# Patient Record
Sex: Female | Born: 1990 | Race: White | Hispanic: No | Marital: Married | Smoking: Never smoker
Health system: Southern US, Community
[De-identification: ages and names within clinical notes are randomized; demographics above are authoritative.]

## PROBLEM LIST (undated history)

## (undated) DIAGNOSIS — G8929 Other chronic pain: Secondary | ICD-10-CM

## (undated) DIAGNOSIS — K589 Irritable bowel syndrome without diarrhea: Secondary | ICD-10-CM

## (undated) DIAGNOSIS — R110 Nausea: Secondary | ICD-10-CM

## (undated) DIAGNOSIS — G43909 Migraine, unspecified, not intractable, without status migrainosus: Secondary | ICD-10-CM

## (undated) DIAGNOSIS — A6920 Lyme disease, unspecified: Secondary | ICD-10-CM

## (undated) DIAGNOSIS — R1013 Epigastric pain: Secondary | ICD-10-CM

## (undated) HISTORY — DX: Epigastric pain: R10.13

## (undated) HISTORY — DX: Other chronic pain: G89.29

## (undated) HISTORY — PX: TUBAL LIGATION: SHX77

## (undated) HISTORY — DX: Migraine, unspecified, not intractable, without status migrainosus: G43.909

## (undated) HISTORY — DX: Nausea: R11.0

## (undated) HISTORY — PX: UPPER GASTROINTESTINAL ENDOSCOPY: SHX188

## (undated) HISTORY — PX: BREAST SURGERY: SHX581

## (undated) HISTORY — PX: TONSILLECTOMY: SUR1361

## (undated) HISTORY — PX: WISDOM TOOTH EXTRACTION: SHX21

## (undated) HISTORY — DX: Lyme disease, unspecified: A69.20

---
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
## 2010-08-13 NOTE — Op Note (Signed)
NAMEJARETZY, LHOMMEDIEU               ACCOUNT NO.:  [id number]   MEDICAL RECORD NO.:  [id number]          PATIENT TYPE:  AMB   LOCATION:  DSC                          FACILITY:  [facility]   PHYSICIAN:  Karol T. Lazarus Salines, M.D. DATE OF BIRTH:  [birth year]   DATE OF PROCEDURE:  02/13/2006  DATE OF DISCHARGE:                               OPERATIVE REPORT   PREOPERATIVE DIAGNOSIS:  Recurrent adenotonsillitis.  Obstructive  hypertrophy.   POSTOPERATIVE DIAGNOSIS:  Recurrent adenotonsillitis.  Obstructive  hypertrophy.   PROCEDURE PERFORMED:  Tonsillectomy, adenoid ablation.   SURGEON:  Gloris Manchester. Lazarus Salines, M.D.   ANESTHESIA:  General orotracheal.   BLOOD LOSS:  10 mL.   COMPLICATIONS:  None.   FINDINGS:  2+ imbedded tonsils with cryptic debris.  Normal soft palate.  Modest residual adenoid pad in between the eustachian tori.   PROCEDURE:  With the patient in the comfortable supine position, general  orotracheal anesthesia was induced without difficulty.  At an  appropriate level, table was turned 90 degrees and the patient placed in  Trendelenburg.  A clean preparation and draping was accomplished.  Taking care to protect lips, teeth, and endotracheal tube, the Crowe-  Davis mouth gag was introduced, expanded for visualization, and  suspended from the Mayo stand in the standard fashion.  The findings  were as described above.  Palate retractor and mirror were used to  visualize the nasopharynx with the findings as described above.  Adenoid  ablation was elected.  1/2% Xylocaine with 1:200,000 epinephrine, 8 mL  total was infiltrated into the peritonsillar planes for intraoperative  hemostasis.  Several minutes were allowed for this to take effect.   A red rubber catheter was passed through the nose and out the mouth to  serve as a Producer, television/film/video.  Using suction cautery and indirect  visualization, the adenoid pad was ablated.  Hemostasis was achieved.   After completing adenoid  ablation, the left tonsil was grasped and  retracted medially.  The mucosa overlying the anterior and superior  poles was coagulated and then cut down to the capsule of the tonsil.  Using the cautery tip as a blunt dissector, lysing fibrous bands, and  coagulating crossing vessels as identified, the tonsil was dissected  from its muscular fossa from inferiorly upwards.  The tonsil was removed  in its entirety as determined by examination both tonsil and fossa.  A  small additional quantity of cautery rendered the fossa hemostatic.  After completing the left tonsillectomy, the mouth gag was relaxed for  several minutes at the tongue had become rather engorged/cyanotic.   After several minutes, the gag was re-expanded and the right tonsil was  removed in identical fashion.   After removing both tonsils and rendering the oropharynx hemostatic, the  nasopharynx was again inspected and observed to be hemostatic.  At this  point the palate retractor and mouth gag were relaxed for several  minutes.  Upon re-expansion, hemostasis was persistent.  At this point  the procedure was completed.  The palate retractor and mouth gag were  relaxed and removed.  The dental status was  intact.  The patient was  returned to Anesthesia, awakened, extubated, and transferred to recovery  in stable condition.   COMMENT:  20 year old white female with recurrent strep throat and  tonsillitis was indication for today's procedure.  Anticipate routine  postoperative recovery with attention to analgesia, antibiosis,  hydration, and observation for bleeding, emesis, or airway compromise.      Gloris Manchester. Lazarus Salines, M.D.  Electronically Signed     KTW/MEDQ  D:  02/13/2006  T:  02/13/2006  Job:  [id number]   cc:   Rosalyn Gess, M.D.

## 2011-03-14 ENCOUNTER — Ambulatory Visit (INDEPENDENT_AMBULATORY_CARE_PROVIDER_SITE_OTHER): Payer: PRIVATE HEALTH INSURANCE

## 2011-03-14 DIAGNOSIS — J111 Influenza due to unidentified influenza virus with other respiratory manifestations: Secondary | ICD-10-CM

## 2011-03-14 LAB — HM PAP SMEAR: HM Pap smear: NORMAL

## 2011-04-02 ENCOUNTER — Ambulatory Visit (INDEPENDENT_AMBULATORY_CARE_PROVIDER_SITE_OTHER): Payer: PRIVATE HEALTH INSURANCE

## 2011-04-02 DIAGNOSIS — R55 Syncope and collapse: Secondary | ICD-10-CM

## 2011-04-02 DIAGNOSIS — J111 Influenza due to unidentified influenza virus with other respiratory manifestations: Secondary | ICD-10-CM

## 2011-04-02 DIAGNOSIS — R509 Fever, unspecified: Secondary | ICD-10-CM

## 2011-04-02 DIAGNOSIS — R5381 Other malaise: Secondary | ICD-10-CM

## 2011-10-10 ENCOUNTER — Telehealth: Payer: Self-pay

## 2011-10-10 NOTE — Telephone Encounter (Signed)
The patient is requesting any labs done in the last year be faxed to her office fax # [phone].  The patient's cell # is [phone] and her work telephone number is [phone] ext [phone]

## 2011-10-10 NOTE — Telephone Encounter (Signed)
Printed out phone message due to patient only having a paper chart. °

## 2011-11-11 ENCOUNTER — Ambulatory Visit: Payer: Self-pay | Admitting: Internal Medicine

## 2011-11-14 ENCOUNTER — Other Ambulatory Visit (INDEPENDENT_AMBULATORY_CARE_PROVIDER_SITE_OTHER): Payer: Self-pay

## 2011-11-14 ENCOUNTER — Encounter: Payer: Self-pay | Admitting: Internal Medicine

## 2011-11-14 ENCOUNTER — Ambulatory Visit (INDEPENDENT_AMBULATORY_CARE_PROVIDER_SITE_OTHER): Payer: Self-pay | Admitting: Internal Medicine

## 2011-11-14 VITALS — BP 108/70 | HR 61 | Temp 97.3°F | Resp 14 | Ht 64.0 in | Wt 107.2 lb

## 2011-11-14 DIAGNOSIS — Z0000 Encounter for general adult medical examination without abnormal findings: Secondary | ICD-10-CM

## 2011-11-14 DIAGNOSIS — H547 Unspecified visual loss: Secondary | ICD-10-CM

## 2011-11-14 DIAGNOSIS — R079 Chest pain, unspecified: Secondary | ICD-10-CM

## 2011-11-14 LAB — CBC WITH DIFFERENTIAL/PLATELET
Basophils Absolute: 0 10*3/uL (ref 0.0–0.1)
Eosinophils Relative: 2 % (ref 0.0–5.0)
HCT: 44 % (ref 36.0–46.0)
Hemoglobin: 14.9 g/dL (ref 12.0–15.0)
Lymphocytes Relative: 35.2 % (ref 12.0–46.0)
Lymphs Abs: 2 10*3/uL (ref 0.7–4.0)
MCHC: 33.8 g/dL (ref 30.0–36.0)
MCV: 90.1 fl (ref 78.0–100.0)
Monocytes Relative: 7.5 % (ref 3.0–12.0)
Neutro Abs: 3.2 10*3/uL (ref 1.4–7.7)
Neutrophils Relative %: 54.7 % (ref 43.0–77.0)
RBC: 4.88 Mil/uL (ref 3.87–5.11)
RDW: 12.4 % (ref 11.5–14.6)
WBC: 5.8 10*3/uL (ref 4.5–10.5)

## 2011-11-14 LAB — LIPID PANEL
Cholesterol: 180 mg/dL (ref 0–200)
HDL: 54.9 mg/dL (ref >39.00)
Total CHOL/HDL Ratio: 3
Triglycerides: 68 mg/dL (ref 0.0–149.0)
VLDL: 13.6 mg/dL (ref 0.0–40.0)

## 2011-11-14 LAB — URINALYSIS, ROUTINE W REFLEX MICROSCOPIC
Bilirubin Urine: NEGATIVE
Hgb urine dipstick: NEGATIVE
Ketones, ur: NEGATIVE
Leukocytes, UA: NEGATIVE
Nitrite: NEGATIVE
Total Protein, Urine: NEGATIVE
Urine Glucose: NEGATIVE
pH: 6 (ref 5.0–8.0)

## 2011-11-14 LAB — COMPREHENSIVE METABOLIC PANEL
ALT: 16 U/L (ref 0–35)
AST: 21 U/L (ref 0–37)
Albumin: 4.3 g/dL (ref 3.5–5.2)
Alkaline Phosphatase: 47 U/L (ref 39–117)
BUN: 15 mg/dL (ref 6–23)
CO2: 27 mEq/L (ref 19–32)
Calcium: 9.7 mg/dL (ref 8.4–10.5)
Chloride: 104 mEq/L (ref 96–112)
Creatinine, Ser: 0.7 mg/dL (ref 0.4–1.2)
GFR: 110.1 mL/min (ref >60.00)
Glucose, Bld: 83 mg/dL (ref 70–99)
Total Bilirubin: 0.8 mg/dL (ref 0.3–1.2)

## 2011-11-14 NOTE — Patient Instructions (Signed)
Chest Pain (Nonspecific) It is often hard to give a specific diagnosis for the cause of chest pain. There is always a chance that your pain could be related to something serious, such as a heart attack or a blood clot in the lungs. You need to follow up with your caregiver for further evaluation. CAUSES   Heartburn.   Pneumonia or bronchitis.   Anxiety or stress.   Inflammation around your heart (pericarditis) or lung (pleuritis or pleurisy).   A blood clot in the lung.   A collapsed lung (pneumothorax). It can develop suddenly on its own (spontaneous pneumothorax) or from injury (trauma) to the chest.   Shingles infection (herpes zoster virus).  The chest wall is composed of bones, muscles, and cartilage. Any of these can be the source of the pain.  The bones can be bruised by injury.   The muscles or cartilage can be strained by coughing or overwork.   The cartilage can be affected by inflammation and become sore (costochondritis).  DIAGNOSIS  Lab tests or other studies, such as X-rays, electrocardiography, stress testing, or cardiac imaging, may be needed to find the cause of your pain.  TREATMENT   Treatment depends on what may be causing your chest pain. Treatment may include:   Acid blockers for heartburn.   Anti-inflammatory medicine.   Pain medicine for inflammatory conditions.   Antibiotics if an infection is present.   You may be advised to change lifestyle habits. This includes stopping smoking and avoiding alcohol, caffeine, and chocolate.   You may be advised to keep your head raised (elevated) when sleeping. This reduces the chance of acid going backward from your stomach into your esophagus.   Most of the time, nonspecific chest pain will improve within 2 to 3 days with rest and mild pain medicine.  HOME CARE INSTRUCTIONS   If antibiotics were prescribed, take your antibiotics as directed. Finish them even if you start to feel better.   For the next few  days, avoid physical activities that bring on chest pain. Continue physical activities as directed.   Do not smoke.   Avoid drinking alcohol.   Only take over-the-counter or prescription medicine for pain, discomfort, or fever as directed by your caregiver.   Follow your caregiver's suggestions for further testing if your chest pain does not go away.   Keep any follow-up appointments you made. If you do not go to an appointment, you could develop lasting (chronic) problems with pain. If there is any problem keeping an appointment, you must call to reschedule.  SEEK MEDICAL CARE IF:   You think you are having problems from the medicine you are taking. Read your medicine instructions carefully.   Your chest pain does not go away, even after treatment.   You develop a rash with blisters on your chest.  SEEK IMMEDIATE MEDICAL CARE IF:   You have increased chest pain or pain that spreads to your arm, neck, jaw, back, or abdomen.   You develop shortness of breath, an increasing cough, or you are coughing up blood.   You have severe back or abdominal pain, feel nauseous, or vomit.   You develop severe weakness, fainting, or chills.   You have a fever.  THIS IS AN EMERGENCY. Do not wait to see if the pain will go away. Get medical help at once. Call your local emergency services (911 in [place] Do not drive yourself to the hospital. MAKE SURE YOU:   Understand these instructions.     Will watch your condition.   Will get help right away if you are not doing well or get worse.  Document Released: 12/22/2004 Document Revised: 03/03/2011 Document Reviewed: 10/18/2007 [facility] Patient Information 2012 [place], [place].Preventive Care for Adults, Female A healthy lifestyle and preventive care can promote health and wellness. Preventive health guidelines for women include the following key practices.  A routine yearly physical is a good way to check with your caregiver about your health and  preventive screening. It is a chance to share any concerns and updates on your health, and to receive a thorough exam.   Visit your dentist for a routine exam and preventive care every 6 months. Brush your teeth twice a day and floss once a day. Good oral hygiene prevents tooth decay and gum disease.   The frequency of eye exams is based on your age, health, family medical history, use of contact lenses, and other factors. Follow your caregiver's recommendations for frequency of eye exams.   Eat a healthy diet. Foods like vegetables, fruits, whole grains, low-fat dairy products, and lean protein foods contain the nutrients you need without too many calories. Decrease your intake of foods high in solid fats, added sugars, and salt. Eat the right amount of calories for you.Get information about a proper diet from your caregiver, if necessary.   Regular physical exercise is one of the most important things you can do for your health. Most adults should get at least 150 minutes of moderate-intensity exercise (any activity that increases your heart rate and causes you to sweat) each week. In addition, most adults need muscle-strengthening exercises on 2 or more days a week.   Maintain a healthy weight. The body mass index (BMI) is a screening tool to identify possible weight problems. It provides an estimate of body fat based on height and weight. Your caregiver can help determine your BMI, and can help you achieve or maintain a healthy weight.For adults 20 years and older:   A BMI below 18.5 is considered underweight.   A BMI of 18.5 to 24.9 is normal.   A BMI of 25 to 29.9 is considered overweight.   A BMI of 30 and above is considered obese.   Maintain normal blood lipids and cholesterol levels by exercising and minimizing your intake of saturated fat. Eat a balanced diet with plenty of fruit and vegetables. Blood tests for lipids and cholesterol should begin at age 57 and be repeated every 5  years. If your lipid or cholesterol levels are high, you are over 50, or you are at high risk for heart disease, you may need your cholesterol levels checked more frequently.Ongoing high lipid and cholesterol levels should be treated with medicines if diet and exercise are not effective.   If you smoke, find out from your caregiver how to quit. If you do not use tobacco, do not start.   If you are pregnant, do not drink alcohol. If you are breastfeeding, be very cautious about drinking alcohol. If you are not pregnant and choose to drink alcohol, do not exceed 1 drink per day. One drink is considered to be 12 ounces (355 mL) of beer, 5 ounces (148 mL) of wine, or 1.5 ounces (44 mL) of liquor.   Avoid use of street drugs. Do not share needles with anyone. Ask for help if you need support or instructions about stopping the use of drugs.   High blood pressure causes heart disease and increases the risk of stroke. Your blood  pressure should be checked at least every 1 to 2 years. Ongoing high blood pressure should be treated with medicines if weight loss and exercise are not effective.   If you are 2 to 23 years old, ask your caregiver if you should take aspirin to prevent strokes.   Diabetes screening involves taking a blood sample to check your fasting blood sugar level. This should be done once every 3 years, after age 29, if you are within normal weight and without risk factors for diabetes. Testing should be considered at a younger age or be carried out more frequently if you are overweight and have at least 1 risk factor for diabetes.   Breast cancer screening is essential preventive care for women. You should practice "breast self-awareness." This means understanding the normal appearance and feel of your breasts and may include breast self-examination. Any changes detected, no matter how small, should be reported to a caregiver. Women in their 34s and 30s should have a clinical breast exam (CBE)  by a caregiver as part of a regular health exam every 1 to 3 years. After age 41, women should have a CBE every year. Starting at age 65, women should consider having a mammography (breast X-ray test) every year. Women who have a family history of breast cancer should talk to their caregiver about genetic screening. Women at a high risk of breast cancer should talk to their caregivers about having magnetic resonance imaging (MRI) and a mammography every year.   The Pap test is a screening test for cervical cancer. A Pap test can show cell changes on the cervix that might become cervical cancer if left untreated. A Pap test is a procedure in which cells are obtained and examined from the lower end of the uterus (cervix).   Women should have a Pap test starting at age 59.   Between ages 54 and 46, Pap tests should be repeated every 2 years.   Beginning at age 62, you should have a Pap test every 3 years as long as the past 3 Pap tests have been normal.   Some women have medical problems that increase the chance of getting cervical cancer. Talk to your caregiver about these problems. It is especially important to talk to your caregiver if a new problem develops soon after your last Pap test. In these cases, your caregiver may recommend more frequent screening and Pap tests.   The above recommendations are the same for women who have or have not gotten the vaccine for human papillomavirus (HPV).   If you had a hysterectomy for a problem that was not cancer or a condition that could lead to cancer, then you no longer need Pap tests. Even if you no longer need a Pap test, a regular exam is a good idea to make sure no other problems are starting.   If you are between ages 56 and 38, and you have had normal Pap tests going back 10 years, you no longer need Pap tests. Even if you no longer need a Pap test, a regular exam is a good idea to make sure no other problems are starting.   If you have had past  treatment for cervical cancer or a condition that could lead to cancer, you need Pap tests and screening for cancer for at least 20 years after your treatment.   If Pap tests have been discontinued, risk factors (such as a new sexual partner) need to be reassessed to determine if screening  should be resumed.   The HPV test is an additional test that may be used for cervical cancer screening. The HPV test looks for the virus that can cause the cell changes on the cervix. The cells collected during the Pap test can be tested for HPV. The HPV test could be used to screen women aged 87 years and older, and should be used in women of any age who have unclear Pap test results. After the age of 26, women should have HPV testing at the same frequency as a Pap test.   Colorectal cancer can be detected and often prevented. Most routine colorectal cancer screening begins at the age of 87 and continues through age 28. However, your caregiver may recommend screening at an earlier age if you have risk factors for colon cancer. On a yearly basis, your caregiver may provide home test kits to check for hidden blood in the stool. Use of a small camera at the end of a tube, to directly examine the colon (sigmoidoscopy or colonoscopy), can detect the earliest forms of colorectal cancer. Talk to your caregiver about this at age 24, when routine screening begins. Direct examination of the colon should be repeated every 5 to 10 years through age 17, unless early forms of pre-cancerous polyps or small growths are found.   Hepatitis C blood testing is recommended for all people born from [birth year] through 1965 and any individual with known risks for hepatitis C.   Practice safe sex. Use condoms and avoid high-risk sexual practices to reduce the spread of sexually transmitted infections (STIs). STIs include gonorrhea, chlamydia, syphilis, trichomonas, herpes, HPV, and human immunodeficiency virus (HIV). Herpes, HIV, and HPV are viral  illnesses that have no cure. They can result in disability, cancer, and death. Sexually active women aged 66 and younger should be checked for chlamydia. Older women with new or multiple partners should also be tested for chlamydia. Testing for other STIs is recommended if you are sexually active and at increased risk.   Osteoporosis is a disease in which the bones lose minerals and strength with aging. This can result in serious bone fractures. The risk of osteoporosis can be identified using a bone density scan. Women ages 57 and over and women at risk for fractures or osteoporosis should discuss screening with their caregivers. Ask your caregiver whether you should take a calcium supplement or vitamin D to reduce the rate of osteoporosis.   Menopause can be associated with physical symptoms and risks. Hormone replacement therapy is available to decrease symptoms and risks. You should talk to your caregiver about whether hormone replacement therapy is right for you.   Use sunscreen with sun protection factor (SPF) of 30 or more. Apply sunscreen liberally and repeatedly throughout the day. You should seek shade when your shadow is shorter than you. Protect yourself by wearing long sleeves, pants, a wide-brimmed hat, and sunglasses year round, whenever you are outdoors.   Once a month, do a whole body skin exam, using a mirror to look at the skin on your back. Notify your caregiver of new moles, moles that have irregular borders, moles that are larger than a pencil eraser, or moles that have changed in shape or color.   Stay current with required immunizations.   Influenza. You need a dose every fall (or winter). The composition of the flu vaccine changes each year, so being vaccinated once is not enough.   Pneumococcal polysaccharide. You need 1 to 2 doses if you smoke  cigarettes or if you have certain chronic medical conditions. You need 1 dose at age 58 (or older) if you have never been vaccinated.     Tetanus, diphtheria, pertussis (Tdap, Td). Get 1 dose of Tdap vaccine if you are younger than age 60, are over 98 and have contact with an infant, are a Research scientist (physical sciences), are pregnant, or simply want to be protected from whooping cough. After that, you need a Td booster dose every 10 years. Consult your caregiver if you have not had at least 3 tetanus and diphtheria-containing shots sometime in your life or have a deep or dirty wound.   HPV. You need this vaccine if you are a woman age 51 or younger. The vaccine is given in 3 doses over 6 months.   Measles, mumps, rubella (MMR). You need at least 1 dose of MMR if you were born in 1957 or later. You may also need a second dose.   Meningococcal. If you are age 24 to 36 and a first-year college student living in a residence hall, or have one of several medical conditions, you need to get vaccinated against meningococcal disease. You may also need additional booster doses.   Zoster (shingles). If you are age 58 or older, you should get this vaccine.   Varicella (chickenpox). If you have never had chickenpox or you were vaccinated but received only 1 dose, talk to your caregiver to find out if you need this vaccine.   Hepatitis A. You need this vaccine if you have a specific risk factor for hepatitis A virus infection or you simply wish to be protected from this disease. The vaccine is usually given as 2 doses, 6 to 18 months apart.   Hepatitis B. You need this vaccine if you have a specific risk factor for hepatitis B virus infection or you simply wish to be protected from this disease. The vaccine is given in 3 doses, usually over 6 months.  Preventive Services / Frequency Ages 73 to 2  Blood pressure check.** / Every 1 to 2 years.   Lipid and cholesterol check.** / Every 5 years beginning at age 40.   Clinical breast exam.** / Every 3 years for women in their 47s and 30s.   Pap test.** / Every 2 years from ages 79 through 25. Every 3  years starting at age 91 through age 66 or 65 with a history of 3 consecutive normal Pap tests.   HPV screening.** / Every 3 years from ages 38 through ages 79 to 12 with a history of 3 consecutive normal Pap tests.   Hepatitis C blood test.** / For any individual with known risks for hepatitis C.   Skin self-exam. / Monthly.   Influenza immunization.** / Every year.   Pneumococcal polysaccharide immunization.** / 1 to 2 doses if you smoke cigarettes or if you have certain chronic medical conditions.   Tetanus, diphtheria, pertussis (Tdap, Td) immunization. / A one-time dose of Tdap vaccine. After that, you need a Td booster dose every 10 years.   HPV immunization. / 3 doses over 6 months, if you are 33 and younger.   Measles, mumps, rubella (MMR) immunization. / You need at least 1 dose of MMR if you were born in 1959 or later. You may also need a second dose.   Meningococcal immunization. / 1 dose if you are age 12 to 44 and a first-year college student living in a residence hall, or have one of several medical conditions, you  need to get vaccinated against meningococcal disease. You may also need additional booster doses.   Varicella immunization.** / Consult your caregiver.   Hepatitis A immunization.** / Consult your caregiver. 2 doses, 6 to 18 months apart.   Hepatitis B immunization.** / Consult your caregiver. 3 doses usually over 6 months.  Ages 29 to 12  Blood pressure check.** / Every 1 to 2 years.   Lipid and cholesterol check.** / Every 5 years beginning at age 19.   Clinical breast exam.** / Every year after age 50.   Mammogram.** / Every year beginning at age 46 and continuing for as long as you are in good health. Consult with your caregiver.   Pap test.** / Every 3 years starting at age 53 through age 52 or 46 with a history of 3 consecutive normal Pap tests.   HPV screening.** / Every 3 years from ages 50 through ages 85 to 35 with a history of 3 consecutive  normal Pap tests.   Fecal occult blood test (FOBT) of stool. / Every year beginning at age 76 and continuing until age 54. You may not need to do this test if you get a colonoscopy every 10 years.   Flexible sigmoidoscopy or colonoscopy.** / Every 5 years for a flexible sigmoidoscopy or every 10 years for a colonoscopy beginning at age 86 and continuing until age 25.   Hepatitis C blood test.** / For all people born from [birth year] through 1967 and any individual with known risks for hepatitis C.   Skin self-exam. / Monthly.   Influenza immunization.** / Every year.   Pneumococcal polysaccharide immunization.** / 1 to 2 doses if you smoke cigarettes or if you have certain chronic medical conditions.   Tetanus, diphtheria, pertussis (Tdap, Td) immunization.** / A one-time dose of Tdap vaccine. After that, you need a Td booster dose every 10 years.   Measles, mumps, rubella (MMR) immunization. / You need at least 1 dose of MMR if you were born in 1957 or later. You may also need a second dose.   Varicella immunization.** / Consult your caregiver.   Meningococcal immunization.** / Consult your caregiver.   Hepatitis A immunization.** / Consult your caregiver. 2 doses, 6 to 18 months apart.   Hepatitis B immunization.** / Consult your caregiver. 3 doses, usually over 6 months.  Ages 77 and over  Blood pressure check.** / Every 1 to 2 years.   Lipid and cholesterol check.** / Every 5 years beginning at age 64.   Clinical breast exam.** / Every year after age 55.   Mammogram.** / Every year beginning at age 87 and continuing for as long as you are in good health. Consult with your caregiver.   Pap test.** / Every 3 years starting at age 90 through age 63 or 53 with a 3 consecutive normal Pap tests. Testing can be stopped between 65 and 70 with 3 consecutive normal Pap tests and no abnormal Pap or HPV tests in the past 10 years.   HPV screening.** / Every 3 years from ages 21 through ages 55  or 45 with a history of 3 consecutive normal Pap tests. Testing can be stopped between 65 and 70 with 3 consecutive normal Pap tests and no abnormal Pap or HPV tests in the past 10 years.   Fecal occult blood test (FOBT) of stool. / Every year beginning at age 50 and continuing until age 22. You may not need to do this test if you get a  colonoscopy every 10 years.   Flexible sigmoidoscopy or colonoscopy.** / Every 5 years for a flexible sigmoidoscopy or every 10 years for a colonoscopy beginning at age 36 and continuing until age 44.   Hepatitis C blood test.** / For all people born from [birth year] through 1967 and any individual with known risks for hepatitis C.   Osteoporosis screening.** / A one-time screening for women ages 69 and over and women at risk for fractures or osteoporosis.   Skin self-exam. / Monthly.   Influenza immunization.** / Every year.   Pneumococcal polysaccharide immunization.** / 1 dose at age 36 (or older) if you have never been vaccinated.   Tetanus, diphtheria, pertussis (Tdap, Td) immunization. / A one-time dose of Tdap vaccine if you are over 65 and have contact with an infant, are a Research scientist (physical sciences), or simply want to be protected from whooping cough. After that, you need a Td booster dose every 10 years.   Varicella immunization.** / Consult your caregiver.   Meningococcal immunization.** / Consult your caregiver.   Hepatitis A immunization.** / Consult your caregiver. 2 doses, 6 to 18 months apart.   Hepatitis B immunization.** / Check with your caregiver. 3 doses, usually over 6 months.  ** Family history and personal history of risk and conditions may change your caregiver's recommendations. Document Released: 05/10/2001 Document Revised: 03/03/2011 Document Reviewed: 08/09/2010 [facility] Patient Information 2012 [place], [place].

## 2011-11-14 NOTE — Assessment & Plan Note (Signed)
She has had this pain for 3 years. Her EKG is normal today. This is musculoskeletal chest wall pain. She was offered reassurance.

## 2011-11-14 NOTE — Progress Notes (Signed)
Subjective:    Patient ID: Stacey Mccullough, female    DOB: 1990/05/13, 21 y.o.   MRN: [id number]  Chest Pain  This is a chronic problem. Episode onset: rare episodes of CP for 3 years. The onset quality is gradual. The problem occurs rarely. The problem has been unchanged. Pain location: "at the apex of her heart" The pain is at a severity of 1/10. The pain is mild. The quality of the pain is described as sharp. The pain does not radiate. Pertinent negatives include no abdominal pain, back pain, claudication, cough, diaphoresis, dizziness, exertional chest pressure, fever, headaches, hemoptysis, irregular heartbeat, leg pain, lower extremity edema, malaise/fatigue, nausea, near-syncope, numbness, orthopnea, palpitations, PND, shortness of breath, sputum production, syncope, vomiting or weakness. She has tried nothing for the symptoms. Risk factors include no known risk factors.  Pertinent negatives for past medical history include no seizures.      Review of Systems  Constitutional: Negative for fever, chills, malaise/fatigue, diaphoresis, activity change, appetite change, fatigue and unexpected weight change.  HENT: Negative.   Eyes: Positive for visual disturbance (decreased VA at night).  Respiratory: Negative for apnea, cough, hemoptysis, sputum production, choking, chest tightness, shortness of breath, wheezing and stridor.   Cardiovascular: Positive for chest pain. Negative for palpitations, orthopnea, claudication, leg swelling, syncope, PND and near-syncope.  Gastrointestinal: Negative for nausea, vomiting, abdominal pain, diarrhea, constipation, blood in stool, abdominal distention, anal bleeding and rectal pain.  Genitourinary: Negative.   Musculoskeletal: Negative for myalgias, back pain, joint swelling, arthralgias and gait problem.  Skin: Negative for color change, pallor, rash and wound.  Neurological: Negative for dizziness, tremors, seizures, syncope, facial asymmetry, speech  difficulty, weakness, light-headedness, numbness and headaches.  Hematological: Negative for adenopathy. Does not bruise/bleed easily.  Psychiatric/Behavioral: Negative.        Objective:   Physical Exam  Vitals reviewed. Constitutional: She is oriented to person, place, and time. She appears well-developed and well-nourished. No distress.  HENT:  Head: Normocephalic and atraumatic.  Mouth/Throat: Oropharynx is clear and moist. No oropharyngeal exudate.  Eyes: Conjunctivae are normal. Right eye exhibits no discharge. Left eye exhibits no discharge. No scleral icterus.  Neck: Normal range of motion. Neck supple. No JVD present. No tracheal deviation present. No thyromegaly present.  Cardiovascular: Normal rate, regular rhythm, normal heart sounds and intact distal pulses.  Exam reveals no gallop and no friction rub.   No murmur heard. Pulmonary/Chest: Effort normal and breath sounds normal. No accessory muscle usage or stridor. Not tachypneic. No respiratory distress. She has no wheezes. She has no rales. Chest wall is not dull to percussion. She exhibits no mass, no tenderness, no bony tenderness, no laceration, no crepitus, no edema, no deformity, no swelling and no retraction.  Abdominal: Soft. Bowel sounds are normal. She exhibits no distension and no mass. There is no tenderness. There is no rebound and no guarding.  Musculoskeletal: Normal range of motion. She exhibits no edema and no tenderness.  Lymphadenopathy:    She has no cervical adenopathy.  Neurological: She is oriented to person, place, and time.  Skin: Skin is warm and dry. No rash noted. She is not diaphoretic. No erythema. No pallor.  Psychiatric: She has a normal mood and affect. Her behavior is normal. Judgment and thought content normal.     No results found for this basename: WBC, HGB, HCT, PLT, GLUCOSE, CHOL, TRIG, HDL, LDLDIRECT, LDLCALC, ALT, AST, NA, K, CL, CREATININE, BUN, CO2, TSH, PSA, INR, GLUF, HGBA1C,  MICROALBUR  Assessment & Plan:

## 2011-11-14 NOTE — Assessment & Plan Note (Signed)
Exam done, labs ordered, vaccines were reviewed, pt ed material was given 

## 2011-11-14 NOTE — Assessment & Plan Note (Signed)
Referral to optometry

## 2012-03-15 ENCOUNTER — Emergency Department (HOSPITAL_COMMUNITY)
Admission: EM | Admit: 2012-03-15 | Discharge: 2012-03-15 | Disposition: A | Discharge status: Home / Self Care | Payer: PRIVATE HEALTH INSURANCE | Attending: Emergency Medicine | Admitting: Emergency Medicine

## 2012-03-15 ENCOUNTER — Emergency Department (HOSPITAL_COMMUNITY): Payer: PRIVATE HEALTH INSURANCE

## 2012-03-15 ENCOUNTER — Encounter (HOSPITAL_COMMUNITY): Payer: Self-pay | Admitting: Emergency Medicine

## 2012-03-15 DIAGNOSIS — Z79899 Other long term (current) drug therapy: Secondary | ICD-10-CM | POA: Insufficient documentation

## 2012-03-15 DIAGNOSIS — R112 Nausea with vomiting, unspecified: Secondary | ICD-10-CM | POA: Insufficient documentation

## 2012-03-15 DIAGNOSIS — Z8619 Personal history of other infectious and parasitic diseases: Secondary | ICD-10-CM | POA: Insufficient documentation

## 2012-03-15 DIAGNOSIS — K5900 Constipation, unspecified: Secondary | ICD-10-CM | POA: Insufficient documentation

## 2012-03-15 DIAGNOSIS — R109 Unspecified abdominal pain: Secondary | ICD-10-CM | POA: Insufficient documentation

## 2012-03-15 LAB — COMPREHENSIVE METABOLIC PANEL
ALT: 11 U/L (ref 0–35)
AST: 23 U/L (ref 0–37)
Albumin: 3.6 g/dL (ref 3.5–5.2)
Alkaline Phosphatase: 41 U/L (ref 39–117)
BUN: 14 mg/dL (ref 6–23)
CO2: 22 mEq/L (ref 19–32)
Chloride: 100 mEq/L (ref 96–112)
GFR calc Af Amer: >90 mL/min (ref >90)
GFR calc non Af Amer: >90 mL/min (ref >90)
Glucose, Bld: 88 mg/dL (ref 70–99)
Potassium: 4 mEq/L (ref 3.5–5.1)
Sodium: 132 mEq/L — ABNORMAL LOW (ref 135–145)
Total Bilirubin: 0.8 mg/dL (ref 0.3–1.2)
Total Protein: 6.5 g/dL (ref 6.0–8.3)

## 2012-03-15 LAB — CBC WITH DIFFERENTIAL/PLATELET
Basophils Absolute: 0 10*3/uL (ref 0.0–0.1)
Basophils Relative: 0 % (ref 0–1)
HCT: 39.6 % (ref 36.0–46.0)
Lymphocytes Relative: 8 % — ABNORMAL LOW (ref 12–46)
Lymphs Abs: 0.5 10*3/uL — ABNORMAL LOW (ref 0.7–4.0)
MCH: 29.9 pg (ref 26.0–34.0)
MCHC: 34.8 g/dL (ref 30.0–36.0)
Monocytes Absolute: 0.4 10*3/uL (ref 0.1–1.0)
Monocytes Relative: 6 % (ref 3–12)
Neutro Abs: 5.3 10*3/uL (ref 1.7–7.7)
Neutrophils Relative %: 85 % — ABNORMAL HIGH (ref 43–77)
Platelets: 163 10*3/uL (ref 150–400)
RBC: 4.61 MIL/uL (ref 3.87–5.11)
RDW: 11.8 % (ref 11.5–15.5)
WBC: 6.3 10*3/uL (ref 4.0–10.5)

## 2012-03-15 LAB — URINALYSIS, ROUTINE W REFLEX MICROSCOPIC
Bilirubin Urine: NEGATIVE
Glucose, UA: NEGATIVE mg/dL
Hgb urine dipstick: NEGATIVE
Ketones, ur: >80 mg/dL — AB
Leukocytes, UA: NEGATIVE
Nitrite: NEGATIVE
Protein, ur: NEGATIVE mg/dL
Specific Gravity, Urine: 1.027 (ref 1.005–1.030)
Urobilinogen, UA: 1 mg/dL (ref 0.0–1.0)
pH: 7 (ref 5.0–8.0)

## 2012-03-15 LAB — POCT PREGNANCY, URINE: Preg Test, Ur: NEGATIVE

## 2012-03-15 LAB — LIPASE, BLOOD: Lipase: 17 U/L (ref 11–59)

## 2012-03-15 MED ORDER — OXYCODONE-ACETAMINOPHEN 5-325 MG PO TABS: 1.0000 | ORAL_TABLET | Freq: Four times a day (QID) | ORAL | Status: DC | PRN

## 2012-03-15 MED ORDER — ONDANSETRON HCL 4 MG/2ML IJ SOLN
4.0000 mg | Freq: Once | INTRAMUSCULAR | Status: AC
  Administered 2012-03-15: 4 mg via INTRAVENOUS
  Filled 2012-03-15: qty 2

## 2012-03-15 MED ORDER — IOHEXOL 300 MG/ML  SOLN
80.0000 mL | Freq: Once | INTRAMUSCULAR | Status: AC | PRN
  Administered 2012-03-15: 80 mL via INTRAVENOUS

## 2012-03-15 MED ORDER — MORPHINE SULFATE 4 MG/ML IJ SOLN
4.0000 mg | Freq: Once | INTRAMUSCULAR | Status: AC
  Administered 2012-03-15: 4 mg via INTRAVENOUS
  Filled 2012-03-15: qty 1

## 2012-03-15 MED ORDER — LANSOPRAZOLE 30 MG PO CPDR: 30.0000 mg | DELAYED_RELEASE_CAPSULE | Freq: Every day | ORAL | Status: DC

## 2012-03-15 MED ORDER — ONDANSETRON 8 MG PO TBDP: 8.0000 mg | ORAL_TABLET | Freq: Three times a day (TID) | ORAL | Status: DC | PRN

## 2012-03-15 MED ORDER — OXYCODONE-ACETAMINOPHEN 5-325 MG PO TABS
1.0000 | ORAL_TABLET | Freq: Once | ORAL | Status: AC
  Administered 2012-03-15: 1 via ORAL
  Filled 2012-03-15: qty 1

## 2012-03-15 MED ORDER — SODIUM CHLORIDE 0.9 % IV BOLUS (SEPSIS)
1000.0000 mL | Freq: Once | INTRAVENOUS | Status: AC
  Administered 2012-03-15: 1000 mL via INTRAVENOUS

## 2012-03-15 NOTE — ED Notes (Signed)
Pt given ginger ale.

## 2012-03-15 NOTE — ED Provider Notes (Signed)
History     CSN: [id number]  Arrival date & time 03/15/12  1756   First MD Initiated Contact with Patient 03/15/12 1940      Chief Complaint  Patient presents with  . Abdominal Pain    (Consider location/radiation/quality/duration/timing/severity/associated sxs/prior treatment) Patient is a 21 y.o. female presenting with abdominal pain. The history is provided by the patient.  Abdominal Pain The primary symptoms of the illness include abdominal pain, nausea and vomiting. The primary symptoms of the illness do not include fever, shortness of breath or diarrhea.  Additional symptoms associated with the illness include constipation. Symptoms associated with the illness do not include back pain.   patient developed upper abdominal pain today. She threw up once. She's also had some constipation. Pain radiates to her right into her back. No fevers. No diarrhea. She states she's had some pain in the epigastric area after eating recently.  Past Medical History  Diagnosis Date  . Lyme disease 1994    Past Surgical History  Procedure Date  . Tonsilectomy, adenoidectomy, bilateral myringotomy and tubes 2007    Family History  Problem Relation Age of Onset  . Hypertension Other   . Cancer Other     Bone Sarcoma    History  Substance Use Topics  . Smoking status: Never Smoker   . Smokeless tobacco: Never Used  . Alcohol Use: 1.2 oz/week    2 Glasses of wine per week    OB History    Grav Para Term Preterm Abortions TAB SAB Ect Mult Living                  Review of Systems  Constitutional: Negative for fever, activity change and appetite change.  HENT: Negative for neck stiffness.   Eyes: Negative for pain.  Respiratory: Negative for chest tightness and shortness of breath.   Cardiovascular: Negative for chest pain and leg swelling.  Gastrointestinal: Positive for nausea, vomiting, abdominal pain and constipation. Negative for diarrhea.  Genitourinary: Negative for  flank pain.  Musculoskeletal: Negative for back pain.  Skin: Negative for rash.  Neurological: Negative for weakness, numbness and headaches.  Psychiatric/Behavioral: Negative for behavioral problems.    Allergies  Review of patient's allergies indicates no known allergies.  Home Medications   Current Outpatient Rx  Name  Route  Sig  Dispense  Refill  . BISMUTH SUBSALICYLATE 262 MG/15ML PO SUSP   Oral   Take 30 mLs by mouth every 6 (six) hours as needed. Stomach upset.         . IBUPROFEN 200 MG PO TABS   Oral   Take 400 mg by mouth every 6 (six) hours as needed. Pain.         Azzie Roup ACE-ETH ESTRAD-FE 1-20 MG-MCG PO TABS   Oral   Take 1 tablet by mouth daily.         Marland Kitchen LANSOPRAZOLE 30 MG PO CPDR   Oral   Take 1 capsule (30 mg total) by mouth daily.   14 capsule   0   . ONDANSETRON 8 MG PO TBDP   Oral   Take 1 tablet (8 mg total) by mouth every 8 (eight) hours as needed for nausea.   10 tablet   0   . OXYCODONE-ACETAMINOPHEN 5-325 MG PO TABS   Oral   Take 1-2 tablets by mouth every 6 (six) hours as needed for pain.   8 tablet   0     BP 160/89  Pulse 106  Temp 98 F (36.7 C) (Oral)  Resp 20  SpO2 100%  LMP 01/11/2012  Physical Exam  Nursing note and vitals reviewed. Constitutional: She is oriented to person, place, and time. She appears well-developed and well-nourished.  HENT:  Head: Normocephalic and atraumatic.  Eyes: EOM are normal. Pupils are equal, round, and reactive to light.  Neck: Normal range of motion. Neck supple.  Cardiovascular: Normal rate, regular rhythm and normal heart sounds.   No murmur heard. Pulmonary/Chest: Effort normal and breath sounds normal. No respiratory distress. She has no wheezes. She has no rales.  Abdominal: Soft. Bowel sounds are normal. She exhibits no distension. There is tenderness. There is no rebound and no guarding.       Epigastric and RUQ tenderness. No rebound or guarding.   Musculoskeletal:  Normal range of motion.  Neurological: She is alert and oriented to person, place, and time. No cranial nerve deficit.  Skin: Skin is warm and dry.  Psychiatric: She has a normal mood and affect. Her speech is normal.    ED Course  Procedures (including critical care time)  Labs Reviewed  URINALYSIS, ROUTINE W REFLEX MICROSCOPIC - Abnormal; Notable for the following:    Ketones, ur >80 (*)     All other components within normal limits  CBC WITH DIFFERENTIAL - Abnormal; Notable for the following:    Neutrophils Relative 85 (*)     Lymphocytes Relative 8 (*)     Lymphs Abs 0.5 (*)     All other components within normal limits  COMPREHENSIVE METABOLIC PANEL - Abnormal; Notable for the following:    Sodium 132 (*)     All other components within normal limits  POCT PREGNANCY, URINE  LIPASE, BLOOD   Ct Abdomen Pelvis W Contrast  03/15/2012  *RADIOLOGY REPORT*  Clinical Data: Epigastric abdominal pain  CT ABDOMEN AND PELVIS WITH CONTRAST  Technique:  Multidetector CT imaging of the abdomen and pelvis was performed following the standard protocol during bolus administration of intravenous contrast.  Contrast: 80mL OMNIPAQUE IOHEXOL 300 MG/ML  SOLN  Comparison: None.  Findings: Limited images through the lung bases demonstrate no significant appreciable abnormality. The heart size is within normal limits. No pleural or pericardial effusion.  Unremarkable liver, biliary system, spleen, pancreas, adrenal glands.  Symmetric renal enhancement.  No hydronephrosis or hydroureter.  No CT evidence for colitis. No right lower quadrant inflammation to suggest appendicitis. No bowel obstruction.  No free intraperitoneal air or fluid.  Normal caliber aorta and branch vessels.  No lymphadenopathy.  Thin-walled bladder.  Unremarkable CT appearance to the uterus and adnexa.  No acute osseous finding.  IMPRESSION: No acute abdominopelvic process.   Original Report Authenticated By: Jearld Lesch, M.D.       1. Abdominal pain       MDM  Patient with upper abdominal pain. Lab work shows only some dehydration. She's greater than 80 ketones. She feels better as tolerated orals here. CT scan was done and was negative. She will be discharged home.        Juliet Rude. Rubin Payor, MD 03/15/12 2325

## 2012-03-15 NOTE — ED Notes (Signed)
Pt at CT

## 2012-03-15 NOTE — ED Notes (Signed)
Patient c/o epigastric pain and N/V (emesis x 1).  Patient c/o constipation, N/V, and pain that radiates to the right and to her back.  Patient had pizza for dinner last night.  Patient denies fevers.

## 2012-05-29 ENCOUNTER — Encounter: Payer: Self-pay | Admitting: Internal Medicine

## 2012-06-26 ENCOUNTER — Ambulatory Visit (INDEPENDENT_AMBULATORY_CARE_PROVIDER_SITE_OTHER): Payer: PRIVATE HEALTH INSURANCE | Admitting: Internal Medicine

## 2012-06-26 ENCOUNTER — Encounter: Payer: Self-pay | Admitting: Internal Medicine

## 2012-06-26 VITALS — BP 98/62 | HR 76 | Ht 61.0 in | Wt 108.2 lb

## 2012-06-26 DIAGNOSIS — R1013 Epigastric pain: Secondary | ICD-10-CM

## 2012-06-26 NOTE — Progress Notes (Signed)
Subjective:    Patient ID: Stacey Mccullough, female    DOB: 30-Sep-1990, 22 y.o.   MRN: [id number]  HPI Very nice CMA at [facility] (Dr. Berneice Heinrich) - has had intermittent epigastric pain. Severe episode in 12/13 - to ED with negative CT and labs. Placed on empiric PPI x 1 month. Since then has had recurrent epigastric pain. Can radiate to back. Has been associated with nausea and vomiting and diarrhea. Does well for weeks with sudden onset, lasts hrs up to 1 day. No fever. No GU sxs. Famotidine ? Some help when taken prn. Cannot pinpoint any specific trigger. Says [place] by ED MD was negative for gallstones prior to CT. GI ROS otherwise negative.  No Known Allergies Outpatient Prescriptions Prior to Visit  Current outpatient prescriptions:famotidine (PEPCID) 10 MG tablet, Take 10 mg by mouth as needed for heartburn., Disp: , Rfl: ;  ibuprofen (ADVIL,MOTRIN) 200 MG tablet, Take 400 mg by mouth every 6 (six) hours as needed. Pain., Disp: , Rfl: ;  norethindrone-ethinyl estradiol (JUNEL FE,GILDESS FE,LOESTRIN FE) 1-20 MG-MCG tablet, Take 1 tablet by mouth daily., Disp: , Rfl:    Past Medical History  Diagnosis Date  . Lyme disease 1994   Past Surgical History  Procedure Laterality Date  . Tonsilectomy, adenoidectomy, bilateral myringotomy and tubes  2007  . Wisdom tooth extraction     History   Social History  . Marital Status: Single    Spouse Name: N/A    Number of Children: 0  . Years of Education: N/A   Occupational History  . CERTIFIED MEDICAL ASSISTANT    Social History Main Topics  . Smoking status: Never Smoker   . Smokeless tobacco: Never Used  . Alcohol Use: 0.0 oz/week     Comment: soacial- 2 per month  . Drug Use: No  . Sexually Active: Yes -- Female partner(s)    Social History Narrative   Caffienated drinks- Yes   Seat belt use often- Yes   Regular Exercise- Yes   Smoke alarm in the home- Yes   Firearms/guns in the home-    History of physical abuse- No   Family  History  Problem Relation Age of Onset  . Hypertension Mother   . Cancer Paternal Uncle     Bone Sarcoma  . Asthma Brother     x 2  . Asthma Sister    Review of Systems Decreased vision (new glasses), some associated headaches. Insomnia, allergy/sinus trouble. All other ROS negative or as per HPI    Objective:   Physical Exam General:  Well-developed, well-nourished and in no acute distress Eyes:  anicteric. ENT:   Mouth and posterior pharynx free of lesions.  Neck:   supple w/o thyromegaly or mass.  Lungs: Clear to auscultation bilaterally. Heart:  S1S2, no rubs, murmurs, gallops. Abdomen:  soft, non-tender, no hepatosplenomegaly, hernia, or mass and BS+.  Lymph:  no cervical or supraclavicular adenopathy. Extremities:   no edema Skin   no rash. Neuro:  A&O x 3.  Psych:  appropriate mood and  Affect.   Data Reviewed:12/19 ED visit URINALYSIS, ROUTINE W REFLEX MICROSCOPIC - Abnormal; Notable for the following:    Ketones, ur  >80 (*)      All other components within normal limits   CBC WITH DIFFERENTIAL - Abnormal; Notable for the following:    Neutrophils Relative  85 (*)      Lymphocytes Relative  8 (*)      Lymphs Abs  0.5 (*)  All other components within normal limits   COMPREHENSIVE METABOLIC PANEL - Abnormal; Notable for the following:    Sodium  132 (*)      All other components within normal limits   POCT PREGNANCY, URINE   LIPASE, BLOOD   Ct Abdomen Pelvis W Contrast  03/15/2012 *RADIOLOGY REPORT* Clinical Data: Epigastric abdominal pain CT ABDOMEN AND PELVIS WITH CONTRAST Technique: Multidetector CT imaging of the abdomen and pelvis was performed following the standard protocol during bolus administration of intravenous contrast. Contrast: 80mL OMNIPAQUE IOHEXOL 300 MG/ML SOLN Comparison: None. Findings: Limited images through the lung bases demonstrate no significant appreciable abnormality. The heart size is within normal limits. No pleural or pericardial  effusion. Unremarkable liver, biliary system, spleen, pancreas, adrenal glands. Symmetric renal enhancement. No hydronephrosis or hydroureter. No CT evidence for colitis. No right lower quadrant inflammation to suggest appendicitis. No bowel obstruction. No free intraperitoneal air or fluid. Normal caliber aorta and branch vessels. No lymphadenopathy. Thin-walled bladder. Unremarkable CT appearance to the uterus and adnexa. No acute osseous finding. IMPRESSION: No acute abdominopelvic process. Original Report Authenticated By: Jearld Lesch, M.D.       Assessment & Plan:  Abdominal pain, epigastric  1. Clinical scenario is consistent with gallstones, I think - will evaluate with RUQ [place] first. If negative can consider HIDA scan with EF. I have explained that if gallstones present would make a surgical referral. i also discussed how abnormal HIDA w/ EF (and gallstones for that matter) not completely predictive that pain is from the gallbladder but that cholecystectomy is frequently offered in these settings.

## 2012-06-26 NOTE — Patient Instructions (Addendum)
You have been scheduled for an abdominal ultrasound at [facility]iology (1st floor of hospital) on 06/29/12 at 7:30am. Please arrive 15 minutes prior to your appointment for registration. Make certain not to have anything to eat or drink 6 hours prior to your appointment. Should you need to reschedule your appointment, please contact radiology at [phone]. This test typically takes about 30 minutes to perform.  Thank you for choosing me and [facility].  Iva Boop, M.D., [facility]

## 2012-06-29 ENCOUNTER — Ambulatory Visit (HOSPITAL_COMMUNITY)
Admission: RE | Admit: 2012-06-29 | Discharge: 2012-06-29 | Disposition: A | Discharge status: Home / Self Care | Payer: PRIVATE HEALTH INSURANCE | Source: Ambulatory Visit | Attending: Internal Medicine | Admitting: Internal Medicine

## 2012-06-29 ENCOUNTER — Other Ambulatory Visit: Payer: Self-pay | Admitting: *Deleted

## 2012-06-29 DIAGNOSIS — R1013 Epigastric pain: Secondary | ICD-10-CM | POA: Insufficient documentation

## 2012-06-29 DIAGNOSIS — A6920 Lyme disease, unspecified: Secondary | ICD-10-CM | POA: Insufficient documentation

## 2012-06-29 DIAGNOSIS — R1011 Right upper quadrant pain: Secondary | ICD-10-CM | POA: Insufficient documentation

## 2012-06-29 NOTE — Progress Notes (Signed)
Quick Note:  [place] is normal  Next step would be HIDA scan with EF as we discussed, to evaluate epigastric pain Please schedule if she agrees ______

## 2012-07-04 ENCOUNTER — Telehealth: Payer: Self-pay | Admitting: Internal Medicine

## 2012-07-04 MED ORDER — ONDANSETRON HCL 8 MG PO TABS: 8.0000 mg | ORAL_TABLET | Freq: Four times a day (QID) | ORAL | Status: DC | PRN

## 2012-07-04 MED ORDER — TRAMADOL HCL 50 MG PO TABS: 50.0000 mg | ORAL_TABLET | Freq: Four times a day (QID) | ORAL | Status: DC | PRN

## 2012-07-04 NOTE — Telephone Encounter (Signed)
Patient reports pain with and after meals and nausea.  She is requesting something for the nausea and pain.  She is going to contact [facility] scheduling and see if they can move up her HIDA and work with her work schedule.  Dr. Leone Payor please advise about pain and nausea meds.

## 2012-07-04 NOTE — Telephone Encounter (Signed)
Patient advised.

## 2012-07-04 NOTE — Telephone Encounter (Signed)
Left message for patient to call back  

## 2012-07-04 NOTE — Telephone Encounter (Signed)
Ondansetron 8 mg every 6 hrs prn nausea #20 no refill  Tramadol 50 mg every 6 hrs prn pain # 30 no refill

## 2012-07-09 ENCOUNTER — Encounter (HOSPITAL_COMMUNITY)
Admission: RE | Admit: 2012-07-09 | Discharge: 2012-07-09 | Disposition: A | Discharge status: Home / Self Care | Payer: PRIVATE HEALTH INSURANCE | Source: Ambulatory Visit | Attending: Internal Medicine | Admitting: Internal Medicine

## 2012-07-09 ENCOUNTER — Telehealth: Payer: Self-pay

## 2012-07-09 DIAGNOSIS — R1013 Epigastric pain: Secondary | ICD-10-CM

## 2012-07-09 MED ORDER — TECHNETIUM TC 99M MEBROFENIN IV KIT
5.5000 | PACK | Freq: Once | INTRAVENOUS | Status: AC | PRN
  Administered 2012-07-09: 6 via INTRAVENOUS

## 2012-07-09 MED ORDER — HYOSCYAMINE SULFATE 0.125 MG SL SUBL: 0.1250 mg | SUBLINGUAL_TABLET | SUBLINGUAL | Status: DC | PRN

## 2012-07-09 NOTE — Telephone Encounter (Signed)
-----   Message -----   From: Iva Boop, MD   Sent: 07/09/2012 3:21 PM   To: Rossie Muskrat, RN   Subject: HIDA results       I click reviewed before writing a note...      HIDA and EF are normal      Does not seem like gallbladder is cause of pain      Recommend      1) EGD for persistent vomiting and epigastric pain dx   2) while waiting for EGD can try hyoscyamine 0.125 mg 1 every 4 hrs prn epigastric pain, may repeat, #60 no refills                           Patient advised .  RX sent.  Patient is scheduled for EGD 08/09/12 and pre-visit 07/24/12

## 2012-07-17 ENCOUNTER — Telehealth: Payer: Self-pay | Admitting: Internal Medicine

## 2012-07-17 NOTE — Telephone Encounter (Signed)
Patient wanted to move her endo up.  I have rescheduled her to 07/31/12 9:30

## 2012-07-20 ENCOUNTER — Other Ambulatory Visit (HOSPITAL_COMMUNITY): Payer: PRIVATE HEALTH INSURANCE

## 2012-07-24 ENCOUNTER — Ambulatory Visit (AMBULATORY_SURGERY_CENTER): Payer: BC Managed Care – PPO

## 2012-07-24 ENCOUNTER — Telehealth: Payer: Self-pay | Admitting: Internal Medicine

## 2012-07-24 VITALS — Ht 61.0 in | Wt 108.4 lb

## 2012-07-24 DIAGNOSIS — R110 Nausea: Secondary | ICD-10-CM

## 2012-07-24 DIAGNOSIS — R1013 Epigastric pain: Secondary | ICD-10-CM

## 2012-07-24 NOTE — Telephone Encounter (Signed)
Spoke to pre-visit they will wait and to see her today

## 2012-07-24 NOTE — Telephone Encounter (Signed)
Patient was seen in pre-visit today

## 2012-07-24 NOTE — Telephone Encounter (Signed)
Left message for patient to call back  

## 2012-07-25 ENCOUNTER — Encounter: Payer: Self-pay | Admitting: Internal Medicine

## 2012-07-31 ENCOUNTER — Encounter: Payer: Self-pay | Admitting: Internal Medicine

## 2012-07-31 ENCOUNTER — Ambulatory Visit (AMBULATORY_SURGERY_CENTER): Payer: PRIVATE HEALTH INSURANCE | Admitting: Internal Medicine

## 2012-07-31 VITALS — BP 116/70 | HR 66 | Temp 97.6°F | Resp 11 | Ht 61.0 in | Wt 108.0 lb

## 2012-07-31 DIAGNOSIS — R1013 Epigastric pain: Secondary | ICD-10-CM

## 2012-07-31 DIAGNOSIS — K319 Disease of stomach and duodenum, unspecified: Secondary | ICD-10-CM

## 2012-07-31 DIAGNOSIS — K2960 Other gastritis without bleeding: Secondary | ICD-10-CM

## 2012-07-31 DIAGNOSIS — K259 Gastric ulcer, unspecified as acute or chronic, without hemorrhage or perforation: Secondary | ICD-10-CM

## 2012-07-31 MED ORDER — SODIUM CHLORIDE 0.9 % IV SOLN: 500.0000 mL | INTRAVENOUS | Status: DC

## 2012-07-31 NOTE — Progress Notes (Signed)
Called to room to assist during endoscopic procedure.  Patient ID and intended procedure confirmed with present staff. Received instructions for my participation in the procedure from the performing physician.  

## 2012-07-31 NOTE — Progress Notes (Signed)
Patient did not have preoperative order for IV antibiotic SSI prophylaxis. (G8918)  Patient did not experience any of the following events: a burn prior to discharge; a fall within the facility; wrong site/side/patient/procedure/implant event; or a hospital transfer or hospital admission upon discharge from the facility. (G8907)  

## 2012-07-31 NOTE — Progress Notes (Signed)
Lidocaine-40mg IV prior to Propofol InductionPropofol given over incremental dosages 

## 2012-07-31 NOTE — Op Note (Signed)
[facility] [address] [place], [address]   ENDOSCOPY PROCEDURE REPORT  PATIENT: Stacey Mccullough, Stacey Mccullough  MR#: [id number] BIRTHDATE: May 08, 1990 , 21  yrs. old GENDER: Female ENDOSCOPIST: Iva Boop, MD, Clementeen Graham REFERRED BY:  Etta Grandchild, M.D. PROCEDURE DATE:  07/31/2012 PROCEDURE:  EGD w/ biopsy ASA CLASS:     Class I INDICATIONS:  Epigastric pain. MEDICATIONS: propofol (Diprivan) 300mg  IV, MAC sedation, administered by CRNA, and These medications were titrated to patient response per physician's verbal order TOPICAL ANESTHETIC: none  DESCRIPTION OF PROCEDURE: After the risks benefits and alternatives of the procedure were thoroughly explained, informed consent was obtained.  The LB [id number] endoscope was introduced through the mouth and advanced to the second portion of the duodenum. Without limitations.  The instrument was slowly withdrawn as the mucosa was fully examined.        STOMACH: Small erosions (a few) were found in the gastric antrum. Multiple biopsies were performed using cold forceps.  Sample sent for histology.  The remainder of the upper endoscopy exam was otherwise normal. Retroflexed views revealed no abnormalities.     The scope was then withdrawn from the patient and the procedure completed.  COMPLICATIONS: There were no complications. ENDOSCOPIC IMPRESSION: 1.   Erosions in the gastric antrum; multiple biopsies 2.   The remainder of the upper endoscopy exam was otherwise normal  RECOMMENDATIONS: 1.  Await pathology results - will call 2.   erosions could be from ibuprofen, not clear they are related to her intermittent pain   eSigned:  Iva Boop, MD, [facility] 07/31/2012 9:59 AM   QQ:[id number] Karsten Ro, MD and The Patient

## 2012-07-31 NOTE — Patient Instructions (Addendum)
There were a few erosions in the stomach - hard to say if they are related to the pain. i took biopsies and will contact you with results and plans (phone call).  Thank you for choosing me and [facility].  Iva Boop, MD, FACG   YOU HAD AN ENDOSCOPIC PROCEDURE TODAY AT THE [facility]: Refer to the procedure report that was given to you for any specific questions about what was found during the examination.  If the procedure report does not answer your questions, please call your gastroenterologist to clarify.  If you requested that your care partner not be given the details of your procedure findings, then the procedure report has been included in a sealed envelope for you to review at your convenience later.  YOU SHOULD EXPECT: Some feelings of bloating in the abdomen. Passage of more gas than usual.  Walking can help get rid of the air that was put into your GI tract during the procedure and reduce the bloating. If you had a lower endoscopy (such as a colonoscopy or flexible sigmoidoscopy) you may notice spotting of blood in your stool or on the toilet paper. If you underwent a bowel prep for your procedure, then you may not have a normal bowel movement for a few days.  DIET: Your first meal following the procedure should be a light meal and then it is ok to progress to your normal diet.  A half-sandwich or bowl of soup is an example of a good first meal.  Heavy or fried foods are harder to digest and may make you feel nauseous or bloated.  Likewise meals heavy in dairy and vegetables can cause extra gas to form and this can also increase the bloating.  Drink plenty of fluids but you should avoid alcoholic beverages for 24 hours.  ACTIVITY: Your care partner should take you home directly after the procedure.  You should plan to take it easy, moving slowly for the rest of the day.  You can resume normal activity the day after the procedure however you should NOT DRIVE  or use heavy machinery for 24 hours (because of the sedation medicines used during the test).    SYMPTOMS TO REPORT IMMEDIATELY: A gastroenterologist can be reached at any hour.  During normal business hours, 8:30 AM to 5:00 PM Monday through Friday, call [phone].  After hours and on weekends, please call the GI answering service at [phone] who will take a message and have the physician on call contact you.   Following upper endoscopy (EGD)  Vomiting of blood or coffee ground material  New chest pain or pain under the shoulder blades  Painful or persistently difficult swallowing  New shortness of breath  Fever of 100F or higher  Black, tarry-looking stools  FOLLOW UP: If any biopsies were taken you will be contacted by phone or by letter within the next 1-3 weeks.  Call your gastroenterologist if you have not heard about the biopsies in 3 weeks.  Our staff will call the home number listed on your records the next business day following your procedure to check on you and address any questions or concerns that you may have at that time regarding the information given to you following your procedure. This is a courtesy call and so if there is no answer at the home number and we have not heard from you through the emergency physician on call, we will assume that you have returned to your regular  daily activities without incident.  SIGNATURES/CONFIDENTIALITY: You and/or your care partner have signed paperwork which will be entered into your electronic medical record.  These signatures attest to the fact that that the information above on your After Visit Summary has been reviewed and is understood.  Full responsibility of the confidentiality of this discharge information lies with you and/or your care-partner.

## 2012-08-01 ENCOUNTER — Telehealth: Payer: Self-pay

## 2012-08-01 NOTE — Telephone Encounter (Signed)
Unable to leave message on answering machine. Recording went straight to busy signal.

## 2012-08-03 ENCOUNTER — Encounter: Payer: PRIVATE HEALTH INSURANCE | Admitting: Internal Medicine

## 2012-08-03 ENCOUNTER — Telehealth: Payer: Self-pay | Admitting: Internal Medicine

## 2012-08-03 NOTE — Telephone Encounter (Signed)
Patient advised results not available yet and she will be notified when they are available

## 2012-08-06 ENCOUNTER — Other Ambulatory Visit: Payer: Self-pay

## 2012-08-06 ENCOUNTER — Telehealth: Payer: Self-pay | Admitting: Internal Medicine

## 2012-08-06 MED ORDER — GLYCOPYRROLATE 2 MG PO TABS: 2.0000 mg | ORAL_TABLET | Freq: Two times a day (BID) | ORAL | Status: DC

## 2012-08-06 MED ORDER — DEXLANSOPRAZOLE 60 MG PO CPDR: 60.0000 mg | DELAYED_RELEASE_CAPSULE | Freq: Every day | ORAL | Status: DC

## 2012-08-06 NOTE — Progress Notes (Signed)
Quick Note:  See phone note also Erosions only  Will advise antispasmodic- PPI also  No letter and no recall ______

## 2012-08-06 NOTE — Telephone Encounter (Signed)
Patient calling for path results.  Please advise 

## 2012-08-06 NOTE — Telephone Encounter (Signed)
Left message for patient to call back  

## 2012-08-06 NOTE — Telephone Encounter (Signed)
Patient advised RX sent in separate encounter She will call back if she wants surgical referral.

## 2012-08-06 NOTE — Telephone Encounter (Signed)
Just in today The biopsies show non-specific inflammation, there is no H. Pylori  I think she should take a PPI x 2 weeks to heal these and also use anti-spasmodic  Has been on hyoscyamine w/o much help   1) glycopyrrolate 2 mg bid prn pain #60 1 refill 2) samples of Nexium or Dexilant x 2 weeks daily please 3) OK to go on mission trip to [place] as best i can tell have not uncovered any serious illness - seems like an IBS like problem w/ slight chance of abnormal gallbladder but negative testing for that 4) If she wants a surgery consult to be evaluated am happy to arrange 5) f/u prn if not better

## 2012-08-09 ENCOUNTER — Encounter: Payer: PRIVATE HEALTH INSURANCE | Admitting: Internal Medicine

## 2013-01-31 ENCOUNTER — Other Ambulatory Visit: Payer: Self-pay

## 2013-05-16 ENCOUNTER — Telehealth: Payer: Self-pay | Admitting: Internal Medicine

## 2013-05-16 ENCOUNTER — Encounter: Payer: Self-pay | Admitting: Physician Assistant

## 2013-05-16 ENCOUNTER — Other Ambulatory Visit: Payer: PRIVATE HEALTH INSURANCE

## 2013-05-16 ENCOUNTER — Ambulatory Visit (HOSPITAL_COMMUNITY)
Admission: RE | Admit: 2013-05-16 | Discharge: 2013-05-16 | Disposition: A | Discharge status: Home / Self Care | Payer: PRIVATE HEALTH INSURANCE | Source: Ambulatory Visit | Attending: Physician Assistant | Admitting: Physician Assistant

## 2013-05-16 ENCOUNTER — Ambulatory Visit (INDEPENDENT_AMBULATORY_CARE_PROVIDER_SITE_OTHER): Payer: PRIVATE HEALTH INSURANCE | Admitting: Physician Assistant

## 2013-05-16 ENCOUNTER — Other Ambulatory Visit (INDEPENDENT_AMBULATORY_CARE_PROVIDER_SITE_OTHER): Payer: PRIVATE HEALTH INSURANCE

## 2013-05-16 VITALS — BP 90/60 | HR 104 | Temp 99.5°F | Ht 61.0 in | Wt 106.5 lb

## 2013-05-16 DIAGNOSIS — R1013 Epigastric pain: Secondary | ICD-10-CM

## 2013-05-16 DIAGNOSIS — R112 Nausea with vomiting, unspecified: Secondary | ICD-10-CM

## 2013-05-16 DIAGNOSIS — R197 Diarrhea, unspecified: Secondary | ICD-10-CM

## 2013-05-16 DIAGNOSIS — R109 Unspecified abdominal pain: Secondary | ICD-10-CM | POA: Insufficient documentation

## 2013-05-16 LAB — AMYLASE: AMYLASE: 60 U/L (ref 27–131)

## 2013-05-16 LAB — CBC WITH DIFFERENTIAL/PLATELET
BASOS PCT: 0.5 % (ref 0.0–3.0)
Basophils Absolute: 0 10*3/uL (ref 0.0–0.1)
EOS PCT: 1.4 % (ref 0.0–5.0)
Eosinophils Absolute: 0.1 10*3/uL (ref 0.0–0.7)
HCT: 45.3 % (ref 36.0–46.0)
Hemoglobin: 15.1 g/dL — ABNORMAL HIGH (ref 12.0–15.0)
Lymphocytes Relative: 10.9 % — ABNORMAL LOW (ref 12.0–46.0)
Lymphs Abs: 0.8 10*3/uL (ref 0.7–4.0)
MCHC: 33.3 g/dL (ref 30.0–36.0)
MCV: 91.5 fl (ref 78.0–100.0)
Monocytes Absolute: 0.5 10*3/uL (ref 0.1–1.0)
Monocytes Relative: 7.5 % (ref 3.0–12.0)
NEUTROS ABS: 5.5 10*3/uL (ref 1.4–7.7)
NEUTROS PCT: 79.7 % — AB (ref 43.0–77.0)
PLATELETS: 210 10*3/uL (ref 150.0–400.0)
RBC: 4.95 Mil/uL (ref 3.87–5.11)
RDW: 12.3 % (ref 11.5–14.6)
WBC: 6.9 10*3/uL (ref 4.5–10.5)

## 2013-05-16 LAB — COMPREHENSIVE METABOLIC PANEL
ALBUMIN: 4.4 g/dL (ref 3.5–5.2)
ALT: 47 U/L — ABNORMAL HIGH (ref 0–35)
AST: 42 U/L — ABNORMAL HIGH (ref 0–37)
Alkaline Phosphatase: 58 U/L (ref 39–117)
BILIRUBIN TOTAL: 0.9 mg/dL (ref 0.3–1.2)
BUN: 11 mg/dL (ref 6–23)
CALCIUM: 9.7 mg/dL (ref 8.4–10.5)
CO2: 22 mEq/L (ref 19–32)
Chloride: 103 mEq/L (ref 96–112)
Creatinine, Ser: 0.9 mg/dL (ref 0.4–1.2)
GFR: 78.32 mL/min (ref >60.00)
GLUCOSE: 88 mg/dL (ref 70–99)
Potassium: 4.3 mEq/L (ref 3.5–5.1)
SODIUM: 136 meq/L (ref 135–145)
Total Protein: 7.3 g/dL (ref 6.0–8.3)

## 2013-05-16 LAB — LIPASE: Lipase: 15 U/L (ref 11.0–59.0)

## 2013-05-16 MED ORDER — IOHEXOL 300 MG/ML  SOLN
80.0000 mL | Freq: Once | INTRAMUSCULAR | Status: AC | PRN
Start: 2013-05-16 — End: 2013-05-16
  Administered 2013-05-16: 80 mL via INTRAVENOUS

## 2013-05-16 MED ORDER — ONDANSETRON HCL 8 MG PO TABS: ORAL_TABLET | ORAL | Status: DC

## 2013-05-16 MED ORDER — TRAMADOL HCL 50 MG PO TABS: ORAL_TABLET | ORAL | Status: DC

## 2013-05-16 MED ORDER — IOHEXOL 300 MG/ML  SOLN
50.0000 mL | Freq: Once | INTRAMUSCULAR | Status: AC | PRN
  Administered 2013-05-16: 50 mL via ORAL

## 2013-05-16 NOTE — Progress Notes (Signed)
Subjective:    Patient ID: Stacey Mccullough, female    DOB: 01/23/1991, 23 y.o.   MRN: [id number]  HPI Stacey Mccullough is a very nice 23 year old female known to Dr. Leone PayorGessner who has undergone prior workup for intermittent complaints of epigastric pain. She has had 2 previous episodes with severe epigastric pain 1 requiring an ER visit in December of 2013. She was then seen in April of 2014. She has undergone upper abdominal ultrasound which was negative, CCK HIDA scan negative ,CT of the abdomen and pelvis negative ,an EGD which showed some mild antral gastritis negative for H. pylori. She was treated with a PPI without much change in her symptoms. Patient says that over the past 8 or 9 months she has had occasional mild episodes of abdominal discomfort and nausea usually triggered by greasy foods but has not had any further severe episodes. Last night she was awakened at about 3:30 AM with severe upper abdominal pain which she describes as sharp and doubling her over. She says her stomach felt a little bit off last evening after eating dinner. Once the acute severe pain started it was located in the upper abdomen and again radiated bilaterally into her back. She is very nauseated ,but unable to vomit has not eaten anything since and has had several episodes of watery diarrhea today. She says this feels very similar to the prior severe episodes. She has not been on a aspirin or NSAIDs. No new medicines supplements etc. he had tried Levsin and the past was not sure that made much difference. She has had some chills today and has a low-grade temp of 99 5 in the office. She says she generally feels bloated and distended with these episodes.    Review of Systems  Constitutional: Positive for appetite change.  HENT: Negative.   Eyes: Negative.   Respiratory: Negative.   Cardiovascular: Negative.   Gastrointestinal: Positive for nausea, vomiting, abdominal pain and diarrhea.  Endocrine: Negative.   Genitourinary:  Negative.   Musculoskeletal: Positive for back pain.  Skin: Negative.   Allergic/Immunologic: Negative.   Neurological: Negative.   Hematological: Negative.   Psychiatric/Behavioral: Negative.    Outpatient Prescriptions Prior to Visit  Medication Sig Dispense Refill  . hyoscyamine (LEVSIN SL) 0.125 MG SL tablet Place 1 tablet (0.125 mg total) under the tongue every 4 (four) hours as needed for cramping.  60 tablet  0  . ondansetron (ZOFRAN) 8 MG tablet Take 1 tablet (8 mg total) by mouth every 6 (six) hours as needed for nausea.  20 tablet  0  . traMADol (ULTRAM) 50 MG tablet Take 1 tablet (50 mg total) by mouth every 6 (six) hours as needed for pain.  30 tablet  0  . dexlansoprazole (DEXILANT) 60 MG capsule Take 1 capsule (60 mg total) by mouth daily.  15 capsule  0  . famotidine (PEPCID) 10 MG tablet Take 10 mg by mouth as needed for heartburn.      Marland Kitchen. glycopyrrolate (ROBINUL) 2 MG tablet Take 1 tablet (2 mg total) by mouth 2 (two) times daily.  60 tablet  1  . ibuprofen (ADVIL,MOTRIN) 200 MG tablet Take 400 mg by mouth every 6 (six) hours as needed. Pain.      . norethindrone-ethinyl estradiol (JUNEL FE,GILDESS FE,LOESTRIN FE) 1-20 MG-MCG tablet Take 1 tablet by mouth daily.       No facility-administered medications prior to visit.   No Known Allergies Patient Active Problem List   Diagnosis Date Noted  .  Chest pain at rest 11/14/2011  . Routine general medical examination at a health care facility 11/14/2011  . Decreased visual acuity 11/14/2011   History  Substance Use Topics  . Smoking status: Never Smoker   . Smokeless tobacco: Never Used  . Alcohol Use: No     Comment: soacial- 2 per month   family history includes Asthma in her brother and sister; Cancer in her paternal uncle; Hypertension in her mother.     Objective:   Physical Exam  well-developed white female in no acute distress, uncomfortable appearing blood pressure 90/60 pulse 104 temperature 99.5 height 5  foot 1 weight 106. HEENT; nontraumatic normocephalic EOMI PERRLA sclera anicteric, Supple; no JVD, Cardiovascular; regular rate and rhythm with S1-S2 slightly tachycardia no murmur or gallop, Pulmonary; clear bilaterally, Abdomen; soft bowel sounds are present but hypoactive she is tender in the epigastrium hypogastrium and across the upper abdomen some guarding no rebound no palpable mass or hepatosplenomegaly, Rectal; exam not done, Extremities; no clubbing cyanosis or edema skin warm and dry, Psych ;mood and affect appropriate        Assessment & Plan:  #21  23 year old female with third discrete episode of severe upper abdominal pain nausea and diarrhea-etiology not clear and workup to date unrevealing She may have an acute gastroenteritis however recurrent nature of these episodes is bothersome. Will rule out volvulus or other mechanical issue with bowel  Plan; CT of the abdomen and pelvis today with contrast CBC with differential, CMET, lipase ,and amylase Ultram 50 mg every 4-6 hours as needed for pain Refill Zofran 4 mg every 6 hours when necessary nausea Clear liquid diet with gradual advancement

## 2013-05-16 NOTE — Patient Instructions (Signed)
[facility]. Go to patient registration inside front door of hospital. You will drink an oral contrast once you get there.  This will take 2 hours.    [facility] [address]. 1. Ultram for pain 2. Zofran for nausea.   You have already had blood drawn today.  We will get back to you with the results of the labs and CT Scan.

## 2013-05-16 NOTE — Telephone Encounter (Signed)
Patient with abdominal pain and severe nausea.  She will come in today and see Amy Esterwood PA at 3:00

## 2013-05-17 NOTE — Progress Notes (Signed)
CT scan was negative. Mild increase in LFT's new - Most of sxs sound like they could be biliary though diarrhea is atypical. Will consider surgical evaluation/consultation.  Iva Booparl E. Gessner, MD, Clementeen GrahamFACG

## 2013-05-21 ENCOUNTER — Other Ambulatory Visit: Payer: Self-pay | Admitting: *Deleted

## 2013-05-21 ENCOUNTER — Ambulatory Visit: Payer: PRIVATE HEALTH INSURANCE

## 2013-05-21 ENCOUNTER — Telehealth: Payer: Self-pay | Admitting: Internal Medicine

## 2013-05-21 ENCOUNTER — Other Ambulatory Visit: Payer: PRIVATE HEALTH INSURANCE

## 2013-05-21 DIAGNOSIS — R197 Diarrhea, unspecified: Secondary | ICD-10-CM

## 2013-05-21 LAB — IGA: IgA: 121 mg/dL (ref 68–378)

## 2013-05-21 NOTE — Progress Notes (Signed)
Please call pt -see how she is feeling... Lets go ahead with surgical consult for her -consider cholecytectomy

## 2013-05-21 NOTE — Telephone Encounter (Signed)
Sammuel CooperAmy S Esterwood, PA-C at 05/21/2013 10:59 AM     Status: Signed        Please call pt -see how she is feeling... Lets go ahead with surgical consult for her -consider cholecytectomy      See above documentation.  Discussed with Amy Esterwood PAm, have patient get labs and hold off on consult until office visit with Dr. Elzie Ringsgessenr

## 2013-05-21 NOTE — Telephone Encounter (Signed)
Attempted to return call.  Office is not open until 10:00 I will try later

## 2013-05-21 NOTE — Telephone Encounter (Signed)
Patient notified

## 2013-05-21 NOTE — Telephone Encounter (Signed)
Patient has pain and bloating with pasta and bread and would like to be tested for celiac.  Is this ok to send her to the lab prior to the appt on 3/4?

## 2013-05-21 NOTE — Telephone Encounter (Signed)
TTG IgA Ab and IgA level please

## 2013-05-22 ENCOUNTER — Telehealth: Payer: Self-pay | Admitting: Internal Medicine

## 2013-05-22 ENCOUNTER — Encounter: Payer: Self-pay | Admitting: Internal Medicine

## 2013-05-22 LAB — TISSUE TRANSGLUTAMINASE, IGA: TISSUE TRANSGLUTAMINASE AB, IGA: 2.1 U/mL (ref <20)

## 2013-05-22 NOTE — Telephone Encounter (Signed)
Labs are not back yet. Patient notified.

## 2013-05-29 ENCOUNTER — Other Ambulatory Visit (INDEPENDENT_AMBULATORY_CARE_PROVIDER_SITE_OTHER): Payer: PRIVATE HEALTH INSURANCE

## 2013-05-29 ENCOUNTER — Ambulatory Visit (INDEPENDENT_AMBULATORY_CARE_PROVIDER_SITE_OTHER): Payer: PRIVATE HEALTH INSURANCE | Admitting: Internal Medicine

## 2013-05-29 ENCOUNTER — Encounter: Payer: Self-pay | Admitting: Internal Medicine

## 2013-05-29 ENCOUNTER — Ambulatory Visit: Payer: PRIVATE HEALTH INSURANCE

## 2013-05-29 VITALS — BP 90/68 | HR 64 | Ht 61.0 in | Wt 105.6 lb

## 2013-05-29 DIAGNOSIS — R740 Nonspecific elevation of levels of transaminase and lactic acid dehydrogenase [LDH]: Secondary | ICD-10-CM

## 2013-05-29 DIAGNOSIS — R197 Diarrhea, unspecified: Secondary | ICD-10-CM

## 2013-05-29 DIAGNOSIS — R7401 Elevation of levels of liver transaminase levels: Secondary | ICD-10-CM

## 2013-05-29 DIAGNOSIS — R748 Abnormal levels of other serum enzymes: Secondary | ICD-10-CM

## 2013-05-29 DIAGNOSIS — R7402 Elevation of levels of lactic acid dehydrogenase (LDH): Secondary | ICD-10-CM

## 2013-05-29 DIAGNOSIS — R1013 Epigastric pain: Secondary | ICD-10-CM

## 2013-05-29 LAB — HEPATIC FUNCTION PANEL
ALT: 53 U/L — ABNORMAL HIGH (ref 0–35)
AST: 34 U/L (ref 0–37)
Albumin: 4.2 g/dL (ref 3.5–5.2)
Alkaline Phosphatase: 56 U/L (ref 39–117)
Bilirubin, Direct: 0.1 mg/dL (ref 0.0–0.3)
Total Bilirubin: 1 mg/dL (ref 0.3–1.2)
Total Protein: 6.7 g/dL (ref 6.0–8.3)

## 2013-05-29 LAB — GAMMA GT: GGT: 13 U/L (ref 7–51)

## 2013-05-29 LAB — C-REACTIVE PROTEIN

## 2013-05-29 MED ORDER — GLYCOPYRROLATE 2 MG PO TABS: 2.0000 mg | ORAL_TABLET | Freq: Two times a day (BID) | ORAL | Status: DC | PRN

## 2013-05-29 NOTE — Progress Notes (Signed)
         Subjective:    Patient ID: Stacey Mccullough, female    DOB: 01/12/1991, 23 y.o.   MRN: [id number]  HPI Marylene Landngela has had more episodes of epigastric pain with abdominal distention and diarrhea.  A repeat CT scan abd/pelvis and labs have been unrevealing though transaminases were slightly elevated.  She has slight help from hyoscamine and had none dfrom PPI. She remains somewhat sore. Celiac abs negative.  Medications, allergies, past medical history, past surgical history, family history and social history are reviewed and updated in the EMR.   Review of Systems As above    Objective:   Physical Exam General:  NAD Abdomen:  Soft, mildly tender epigastrium, BS+  Data Reviewed:  As above     Assessment & Plan:  Diarrhea - Plan: glycopyrrolate (ROBINUL) 2 MG tablet, C-reactive protein, Hepatic function panel  Abdominal pain, epigastric - Plan: glycopyrrolate (ROBINUL) 2 MG tablet, C-reactive protein, Hepatic function panel  Abnormal transaminases - Plan: C-reactive protein, Hepatic function panel  Lab Results  Component Value Date   ALT 53* 05/29/2013   AST 34 05/29/2013   ALKPHOS 56 05/29/2013   BILITOT 1.0 05/29/2013   GGT is NL as is CRP  Plan to evaluate with colonoscopy - ? If she has IBD or IBS  Acute severe pain sounds like it could be GB but bloating and diarrhea do not. Trial of glycopyrrolate.  The risks and benefits as well as alternatives of endoscopic procedure(s) have been discussed and reviewed. All questions answered. The patient agrees to proceed.

## 2013-05-29 NOTE — Progress Notes (Signed)
Contacted Lori in the lab and added GGT to blood drawn today.  Add on slip faxed to lab.

## 2013-05-29 NOTE — Patient Instructions (Signed)
We have sent the following medications to your pharmacy for you to pick up at your convenience: Generic Robinul, this is to replace your generic Levisin   Your physician has requested that you go to the basement for the following lab work before leaving today: LFT's, C-Reactive Protein   It has been recommended to you by your physician that you have a(n) colonoscopy completed We did not schedule the procedure(s) today. We will have to call you to set up a pre-visit and colon appointment as soon as the schedule is out for May.   I appreciate the opportunity to care for you.

## 2013-05-29 NOTE — Progress Notes (Signed)
Quick Note:  Please have them add a GGT (gamma-glutamyl transferase) to this ______

## 2013-05-30 ENCOUNTER — Encounter: Payer: Self-pay | Admitting: Internal Medicine

## 2013-06-06 ENCOUNTER — Encounter: Payer: Self-pay | Admitting: Internal Medicine

## 2013-06-14 ENCOUNTER — Telehealth: Payer: Self-pay | Admitting: Internal Medicine

## 2013-06-14 DIAGNOSIS — R109 Unspecified abdominal pain: Secondary | ICD-10-CM

## 2013-06-14 NOTE — Telephone Encounter (Signed)
Ok If she wants to move up her colonoscopy could try to do next week at hospital ? Friday [facility] Can check with her on Monday

## 2013-06-14 NOTE — Telephone Encounter (Signed)
Patient reports that she is having abdominal  pain and had to leave work.  She reports that the pain is not relieved by Tramadol.  She has hydrocodone left over from a dental procedure and wanted to know if she could take that.  She is advised that she can take that, but that if her pain is not relieved or worsens she will need to be evaluated in the ER or Urgent Care over the weekend

## 2013-06-17 ENCOUNTER — Other Ambulatory Visit: Payer: Self-pay

## 2013-06-17 DIAGNOSIS — R109 Unspecified abdominal pain: Secondary | ICD-10-CM

## 2013-06-17 MED ORDER — MOVIPREP 100 G PO SOLR: 1.0000 | Freq: Once | ORAL | Status: DC

## 2013-06-17 NOTE — Telephone Encounter (Signed)
Left message for patient to call back  

## 2013-06-17 NOTE — Telephone Encounter (Signed)
[facility].  She is aware.  I have faxed her instructions to [phone]

## 2013-06-21 ENCOUNTER — Ambulatory Visit (HOSPITAL_COMMUNITY)
Admission: RE | Admit: 2013-06-21 | Discharge: 2013-06-21 | Disposition: A | Discharge status: Home / Self Care | Payer: PRIVATE HEALTH INSURANCE | Source: Ambulatory Visit | Attending: Internal Medicine | Admitting: Internal Medicine

## 2013-06-21 ENCOUNTER — Encounter (HOSPITAL_COMMUNITY)
Admission: RE | Disposition: A | Discharge status: Home / Self Care | Payer: PRIVATE HEALTH INSURANCE | Source: Ambulatory Visit | Attending: Internal Medicine

## 2013-06-21 ENCOUNTER — Encounter (HOSPITAL_COMMUNITY): Payer: Self-pay | Admitting: *Deleted

## 2013-06-21 DIAGNOSIS — R7401 Elevation of levels of liver transaminase levels: Secondary | ICD-10-CM | POA: Insufficient documentation

## 2013-06-21 DIAGNOSIS — R740 Nonspecific elevation of levels of transaminase and lactic acid dehydrogenase [LDH]: Secondary | ICD-10-CM

## 2013-06-21 DIAGNOSIS — R109 Unspecified abdominal pain: Secondary | ICD-10-CM

## 2013-06-21 DIAGNOSIS — R7402 Elevation of levels of lactic acid dehydrogenase (LDH): Secondary | ICD-10-CM | POA: Insufficient documentation

## 2013-06-21 DIAGNOSIS — R197 Diarrhea, unspecified: Secondary | ICD-10-CM | POA: Insufficient documentation

## 2013-06-21 DIAGNOSIS — R143 Flatulence: Secondary | ICD-10-CM

## 2013-06-21 DIAGNOSIS — G8929 Other chronic pain: Secondary | ICD-10-CM

## 2013-06-21 DIAGNOSIS — R141 Gas pain: Secondary | ICD-10-CM | POA: Insufficient documentation

## 2013-06-21 DIAGNOSIS — R142 Eructation: Secondary | ICD-10-CM | POA: Insufficient documentation

## 2013-06-21 DIAGNOSIS — R1013 Epigastric pain: Secondary | ICD-10-CM | POA: Insufficient documentation

## 2013-06-21 HISTORY — PX: COLONOSCOPY: SHX5424

## 2013-06-21 SURGERY — COLONOSCOPY
Anesthesia: Moderate Sedation

## 2013-06-21 MED ORDER — FENTANYL CITRATE 0.05 MG/ML IJ SOLN
INTRAMUSCULAR | Status: AC
  Filled 2013-06-21: qty 4

## 2013-06-21 MED ORDER — MIDAZOLAM HCL 5 MG/5ML IJ SOLN
INTRAMUSCULAR | Status: DC | PRN
  Administered 2013-06-21 (×2): 2.5 mg via INTRAVENOUS

## 2013-06-21 MED ORDER — SODIUM CHLORIDE 0.9 % IV SOLN
INTRAVENOUS | Status: DC
  Administered 2013-06-21: 500 mL via INTRAVENOUS

## 2013-06-21 MED ORDER — MIDAZOLAM HCL 10 MG/2ML IJ SOLN
INTRAMUSCULAR | Status: AC
  Filled 2013-06-21: qty 4

## 2013-06-21 MED ORDER — DIPHENHYDRAMINE HCL 50 MG/ML IJ SOLN
INTRAMUSCULAR | Status: AC
  Filled 2013-06-21: qty 1

## 2013-06-21 MED ORDER — FENTANYL CITRATE 0.05 MG/ML IJ SOLN
INTRAMUSCULAR | Status: DC | PRN
  Administered 2013-06-21 (×2): 25 ug via INTRAVENOUS

## 2013-06-21 NOTE — Discharge Instructions (Signed)
Everything looked ok.  I did take biopsies of the end of the small intestine - the terminal ileum and should have that information for you next week.   I appreciate the opportunity to care for you. Iva Booparl E. Gessner, MD, FACG  YOU HAD AN ENDOSCOPIC PROCEDURE TODAY: Refer to the procedure report and other information in the discharge instructions given to you for any specific questions about what was found during the examination. If this information does not answer your questions, please call Dr. Marvell FullerGessner's office at [phone] to clarify.   YOU SHOULD EXPECT: Some feelings of bloating in the abdomen. Passage of more gas than usual. Walking can help get rid of the air that was put into your GI tract during the procedure and reduce the bloating. If you had a lower endoscopy (such as a colonoscopy or flexible sigmoidoscopy) you may notice spotting of blood in your stool or on the toilet paper. Some abdominal soreness may be present for a day or two, also.  DIET: Your first meal following the procedure should be a light meal and then it is ok to progress to your normal diet. A half-sandwich or bowl of soup is an example of a good first meal. Heavy or fried foods are harder to digest and may make you feel nauseous or bloated. Drink plenty of fluids but you should avoid alcoholic beverages for 24 hours.   ACTIVITY: Your care partner should take you home directly after the procedure. You should plan to take it easy, moving slowly for the rest of the day. You can resume normal activity the day after the procedure however YOU SHOULD NOT DRIVE, use power tools, machinery or perform tasks that involve climbing or major physical exertion for 24 hours (because of the sedation medicines used during the test).   SYMPTOMS TO REPORT IMMEDIATELY: A gastroenterologist can be reached at any hour. Please call [phone]  for any of the following symptoms:  Following lower endoscopy (colonoscopy, flexible  sigmoidoscopy) Excessive amounts of blood in the stool  Significant tenderness, worsening of abdominal pains  Swelling of the abdomen that is new, acute  Fever of 100 or higher   FOLLOW UP:  If any biopsies were taken you will be contacted by phone or by letter within the next 1-3 weeks. Call [phone]  if you have not heard about the biopsies in 3 weeks.  Please also call with any specific questions about appointments or follow up tests.  Conscious Sedation, Adult, Care After Refer to this sheet in the next few weeks. These instructions provide you with information on caring for yourself after your procedure. Your health care provider may also give you more specific instructions. Your treatment has been planned according to current medical practices, but problems sometimes occur. Call your health care provider if you have any problems or questions after your procedure. WHAT TO EXPECT AFTER THE PROCEDURE  After your procedure:  You may feel sleepy, clumsy, and have poor balance for several hours.  Vomiting may occur if you eat too soon after the procedure. HOME CARE INSTRUCTIONS  Do not participate in any activities where you could become injured for at least 24 hours. Do not:  Drive.  Swim.  Ride a bicycle.  Operate heavy machinery.  Cook.  Use power tools.  Climb ladders.  Work from a high place.  Do not make important decisions or sign legal documents until you are improved.  If you vomit, drink water, juice, or soup when you can  drink without vomiting. Make sure you have little or no nausea before eating solid foods.  Only take over-the-counter or prescription medicines for pain, discomfort, or fever as directed by your health care provider.  Make sure you and your family fully understand everything about the medicines given to you, including what side effects may occur.  You should not drink alcohol, take sleeping pills, or take medicines that cause drowsiness  for at least 24 hours.  If you smoke, do not smoke without supervision.  If you are feeling better, you may resume normal activities 24 hours after you were sedated.  Keep all appointments with your health care provider. SEEK MEDICAL CARE IF:  Your skin is pale or bluish in color.  You continue to feel nauseous or vomit.  Your pain is getting worse and is not helped by medicine.  You have bleeding or swelling.  You are still sleepy or feeling clumsy after 24 hours. SEEK IMMEDIATE MEDICAL CARE IF:  You develop a rash.  You have difficulty breathing.  You develop any type of allergic problem.  You have a fever. MAKE SURE YOU:  Understand these instructions.  Will watch your condition.  Will get help right away if you are not doing well or get worse. Document Released: 01/02/2013 Document Reviewed: 10/19/2012 [facility] Patient Information 2014 [place], [place].

## 2013-06-21 NOTE — Op Note (Signed)
[facility] [address]  [place], [id number]   COLONOSCOPY PROCEDURE REPORT  PATIENT: Stacey Mccullough, Stacey  MR#: [id number] BIRTHDATE: 31-Oct-1990 , 22  yrs. old GENDER: Female ENDOSCOPIST: Iva Booparl E Robynn Marcel, MD, [facility] PROCEDURE DATE:  06/21/2013 PROCEDURE:   Colonoscopy with biopsy First Screening Colonoscopy - Avg.  risk and is 50 yrs.  old or older - No.  Prior Negative Screening - Now for repeat screening. N/A  History of Adenoma - Now for follow-up colonoscopy & has been > or = to 3 yrs.  N/A  Polyps Removed Today? No.  Recommend repeat exam, <10 yrs? No. ASA CLASS:   Class I INDICATIONS:epigastric abdominal pain and unexplained diarrhea. MEDICATIONS: Fentanyl 100 mcg IV and Versed 10 mg IV  DESCRIPTION OF PROCEDURE:   After the risks benefits and alternatives of the procedure were thoroughly explained, informed consent was obtained.  A digital rectal exam revealed no abnormalities of the rectum.   The Pentax Ped Colon [id number] endoscope was introduced through the anus and advanced to the terminal ileum which was intubated for a short distance. No adverse events experienced.   The quality of the prep was excellent, using MoviPrep  The instrument was then slowly withdrawn as the colon was fully examined.      COLON FINDINGS: The mucosa appeared normal in the terminal ileum. Multiple biopsies were performed.   A normal appearing cecum, ileocecal valve, and appendiceal orifice were identified.  The ascending, hepatic flexure, transverse, splenic flexure, descending, sigmoid colon and rectum appeared unremarkable.  No polyps or cancers were seen.  Retroflexed views revealed no abnormalities. The time to cecum=5 minutes 0 seconds.  Withdrawal time=8 minutes 0 seconds.  The scope was withdrawn and the procedure completed. COMPLICATIONS: There were no complications.  ENDOSCOPIC IMPRESSION: 1.   Normal mucosa in the terminal ileum; multiple biopsies were performed ?  occult Crohn's possible as cause of her problems 2.   Normal colon - excellent prep  RECOMMENDATIONS: Await biopsy results - will contact - consider Meckel's scan/IBD panel   eSigned:  Iva Booparl E Sharod Petsch, MD, [facility] 06/21/2013 10:40 AM   cc: The Patient

## 2013-06-21 NOTE — Interval H&P Note (Signed)
History and Physical Interval Note:  06/21/2013 9:41 AM  Stacey StainAngela [name]  has presented today for surgery, with the diagnosis of abdominal pain and diarrhea. The various methods of treatment have been discussed with the patient and family. After consideration of risks, benefits and other options for treatment, the patient has consented to  Procedure(s): COLONOSCOPY (N/A) as a surgical intervention .  The patient's history has been reviewed, patient examined, no change in status, stable for surgery.  I have reviewed the patient's chart and labs.  Questions were answered to the patient's satisfaction.     Stan Headarl Brennen Gardiner

## 2013-06-21 NOTE — H&P (View-Only) (Signed)
         Subjective:    Patient ID: Stacey Mccullough, female    DOB: 04/07/1990, 23 y.o.   MRN: [id number]  HPI Stacey Mccullough has had more episodes of epigastric pain with abdominal distention and diarrhea.  A repeat CT scan abd/pelvis and labs have been unrevealing though transaminases were slightly elevated.  She has slight help from hyoscamine and had none dfrom PPI. She remains somewhat sore. Celiac abs negative.  Medications, allergies, past medical history, past surgical history, family history and social history are reviewed and updated in the EMR.   Review of Systems As above    Objective:   Physical Exam General:  NAD Abdomen:  Soft, mildly tender epigastrium, BS+  Data Reviewed:  As above     Assessment & Plan:  Diarrhea - Plan: glycopyrrolate (ROBINUL) 2 MG tablet, C-reactive protein, Hepatic function panel  Abdominal pain, epigastric - Plan: glycopyrrolate (ROBINUL) 2 MG tablet, C-reactive protein, Hepatic function panel  Abnormal transaminases - Plan: C-reactive protein, Hepatic function panel  Lab Results  Component Value Date   ALT 53* 05/29/2013   AST 34 05/29/2013   ALKPHOS 56 05/29/2013   BILITOT 1.0 05/29/2013   GGT is NL as is CRP  Plan to evaluate with colonoscopy - ? If she has IBD or IBS  Acute severe pain sounds like it could be GB but bloating and diarrhea do not. Trial of glycopyrrolate.  The risks and benefits as well as alternatives of endoscopic procedure(s) have been discussed and reviewed. All questions answered. The patient agrees to proceed.

## 2013-06-24 ENCOUNTER — Telehealth: Payer: Self-pay

## 2013-06-24 ENCOUNTER — Encounter (HOSPITAL_COMMUNITY): Payer: Self-pay | Admitting: Internal Medicine

## 2013-06-24 DIAGNOSIS — G8929 Other chronic pain: Secondary | ICD-10-CM

## 2013-06-24 DIAGNOSIS — R1013 Epigastric pain: Principal | ICD-10-CM

## 2013-06-24 NOTE — Telephone Encounter (Signed)
Message copied by Annett FabianJONES, Aesha Agrawal L on Mon Jun 24, 2013 11:54 AM ------      Message from: Iva BoopGESSNER, CARL E      Created: Fri Jun 21, 2013 10:49 AM      Regarding: appt Dr. Abbey Chattersrosenbower       [facility]      Please get her an appt w/ Dr. Abbey Chattersosenbower re: recurrent epigastric pain, ? Gallbladder            Not urgent            Let me know date and time and I can tell her next week ------

## 2013-06-24 NOTE — Telephone Encounter (Signed)
Patient is scheduled for 07/16/13 9:45 arrival for 10:10 appointment.  She is notified of the appt date and time.

## 2013-06-24 NOTE — Telephone Encounter (Signed)
Referral placed [facility] will call me back with the appt date and time

## 2013-06-25 ENCOUNTER — Encounter: Payer: Self-pay | Admitting: Internal Medicine

## 2013-06-25 NOTE — Progress Notes (Signed)
Quick Note:  Notified via My Chart ______ 

## 2013-07-16 ENCOUNTER — Ambulatory Visit (INDEPENDENT_AMBULATORY_CARE_PROVIDER_SITE_OTHER): Payer: PRIVATE HEALTH INSURANCE | Admitting: General Surgery

## 2013-07-16 ENCOUNTER — Encounter (INDEPENDENT_AMBULATORY_CARE_PROVIDER_SITE_OTHER): Payer: Self-pay | Admitting: General Surgery

## 2013-07-16 VITALS — BP 90/60 | HR 80 | Temp 97.8°F | Resp 16 | Ht 60.0 in | Wt 105.4 lb

## 2013-07-16 DIAGNOSIS — R1013 Epigastric pain: Secondary | ICD-10-CM

## 2013-07-16 NOTE — Patient Instructions (Signed)
Call if you would like to have the surgery, [phone], ask for a surgery scheduler.

## 2013-07-16 NOTE — Progress Notes (Addendum)
Patient ID: Stacey Mccullough, female   DOB: 05/27/1990, 23 y.o.   MRN: [id number]  Chief Complaint  Patient presents with  . New Evaluation    eval epigastric pain possible GB    HPI Stacey Mccullough is a 23 y.o. female.   HPI  She is referred by Dr. Leone PayorGessner because of persistent epigastric pain radiating to the right upper quadrant.  This does seem to be worse after a fatty meal.  She has had an exhaustive workup. She's had upper endoscopy with no obvious revealing cause for her pain. Gallbladder ultrasound is normal. Hepatobiliary scan is normal. CT scan is unrevealing. She does have a mild elevation of one of her transaminases.  The celiac disease panel is negative.  Her pain is suggestive of biliary colic and now she presents to discuss possible cholecystectomy. There is no family history of gallbladder disease.   Past Medical History  Diagnosis Date  . Lyme disease 1994  . Chronic epigastric pain     since Dec/comes and goes past eating  . Nausea     Past Surgical History  Procedure Laterality Date  . Tonsillectomy and adenoidectomy  2007  . Wisdom tooth extraction    . Upper gastrointestinal endoscopy    . Tonsillectomy    . Colonoscopy N/A 06/21/2013    Procedure: COLONOSCOPY;  Surgeon: Iva Booparl E Gessner, MD;  Location: [facility];  Service: Endoscopy;  Laterality: N/A;    Family History  Problem Relation Age of Onset  . Hypertension Mother   . Cancer Paternal Uncle     Bone Sarcoma  . Asthma Brother     x 2  . Asthma Sister     Social History History  Substance Use Topics  . Smoking status: Never Smoker   . Smokeless tobacco: Never Used  . Alcohol Use: No     Comment: soacial- 2 per month    No Known Allergies  Current Outpatient Prescriptions  Medication Sig Dispense Refill  . acetaminophen (TYLENOL) 500 MG tablet Take 500 mg by mouth as needed.      Marland Kitchen. glycopyrrolate (ROBINUL) 2 MG tablet Take 1 tablet (2 mg total) by mouth 2 (two) times daily as needed.  180  tablet  0  . Lactobacillus-Inulin (CULTURELLE DIGESTIVE HEALTH) CAPS Take 1 capsule by mouth daily.      . ondansetron (ZOFRAN) 8 MG tablet Take 1 tab every6hours as needed for nausea.  40 tablet  0  . traMADol (ULTRAM) 50 MG tablet Take 1 tab every 4-6 hours for pain  40 tablet  0   No current facility-administered medications for this visit.    Review of Systems Review of Systems  Constitutional: Negative.   HENT: Negative.   Respiratory: Negative.   Cardiovascular: Negative.   Gastrointestinal: Positive for abdominal pain and diarrhea.  Genitourinary: Negative.   Neurological: Negative.   Hematological: Negative.     Blood pressure 90/60, pulse 80, temperature 97.8 F (36.6 C), temperature source Temporal, resp. rate 16, height 5' (1.524 m), weight 105 lb 6.4 oz (47.809 kg), last menstrual period 06/06/2013.  Physical Exam Physical Exam  Constitutional: She appears well-developed and well-nourished. No distress.  HENT:  Head: Normocephalic and atraumatic.  Eyes: EOM are normal. No scleral icterus.  Cardiovascular: Normal rate and regular rhythm.   Pulmonary/Chest: Effort normal and breath sounds normal.  Abdominal: Soft. She exhibits no distension and no mass. There is no tenderness.  Musculoskeletal: She exhibits no edema.  Neurological: She is alert.  Skin:  Skin is warm and dry.  Psychiatric: She has a normal mood and affect. Her behavior is normal.    Data Reviewed Notes from Dr. Leone PayorGessner. Laboratory reports. Radiology reports.  Assessment    Epigastric and right upper quadrant pain. Pain is suggestive of biliary colic but no studies support gallbladder disease. She also has a history of irritable bowel syndrome.     Plan    We discussed laparoscopic cholecystectomy but I told her there was no way to know if this was going to relieve her symptoms.  I explained to her that it could be diagnostic and that it did not relieve her symptoms or it could be diagnostic and  therapeutic and it did relieve her symptoms.  I explained the procedure, risks, and aftercare of cholecystectomy.  Risks include but are not limited to bleeding, infection, wound problems, anesthesia, diarrhea, bile leak, injury to common bile duct/liver/intestine, failure to improve her symptoms.  She wants to go home and think about it. She will call us back if she would like to proceed with the operation.         Jim Desanctisodd J Anouk Critzer 07/16/2013, 11:10 AM

## 2013-08-09 ENCOUNTER — Encounter: Payer: PRIVATE HEALTH INSURANCE | Admitting: Internal Medicine

## 2014-01-31 ENCOUNTER — Telehealth: Payer: Self-pay | Admitting: Internal Medicine

## 2014-01-31 MED ORDER — AMBULATORY NON FORMULARY MEDICATION: Status: DC

## 2014-01-31 NOTE — Telephone Encounter (Signed)
Patient said if probiotic prescribed may be cheaper.  She has tried the AvalaBacid in the past and liked it.  [facility].

## 2015-12-07 LAB — OB RESULTS CONSOLE HEPATITIS B SURFACE ANTIGEN: Hepatitis B Surface Ag: NEGATIVE

## 2015-12-07 LAB — OB RESULTS CONSOLE RUBELLA ANTIBODY, IGM: RUBELLA: IMMUNE

## 2015-12-07 LAB — OB RESULTS CONSOLE GC/CHLAMYDIA
Chlamydia: NEGATIVE
Gonorrhea: NEGATIVE

## 2015-12-07 LAB — OB RESULTS CONSOLE HIV ANTIBODY (ROUTINE TESTING): HIV: NONREACTIVE

## 2015-12-07 LAB — OB RESULTS CONSOLE RPR: RPR: NONREACTIVE

## 2016-03-28 NOTE — L&D Delivery Note (Addendum)
Delivery Note Pt labored well to complete at 8pm. She then labored down for 30-40 mins and begun pushing.  At 9:21 PM a viable female was delivered via Vaginal, Spontaneous Delivery (Presentation:LOA ;  ).  APGAR: 9, 9; weight pending  .   Placenta status: delivered intact, shultz , .  Cord: 3vc with the following complications:none .  Cord pH: n/a  Anesthesia:  Epidural Episiotomy: None Lacerations: 2nd degree;Perineal Suture Repair: 2.0 vicryl Est. Blood Loss (mL): 150  Mom to postpartum.  Baby to Couplet care / Skin to Skin. Of note epidural catheter will be left in place till repeat cbc done   Repeat cbc 120 ( from 110)  Maureena Dabbs W Esgar Barnick 07/19/2016, 10:12 PM

## 2016-06-19 LAB — OB RESULTS CONSOLE GBS: GBS: NEGATIVE

## 2016-06-24 ENCOUNTER — Telehealth (HOSPITAL_COMMUNITY): Payer: Self-pay | Admitting: Lactation Services

## 2016-06-24 NOTE — Telephone Encounter (Signed)
Patient is [redacted] weeks pregnant and has inverted nipples bilaterally (her R nipple is more so inverted than her L nipple). She has begun wearing Supple Cups to help evert her nipples (OK'd by Dr. Ellyn Hack), which has been helpful.  Patient had some questions about lactation assistance while in the hospital, latching while infant is skin-to-skin, etc.   Patient reports + breast changes w/pregnancy. She began to leak around 19 weeks. She has already purchased [employer] nipple shields to bring to the hospital. She is unsure if the nipple shields she bought were the correct size for her. I made patient aware that we may need to use our Medela nipple shields if we determine that she needs a different size.  Mom already has her Medela PIS that she received through insurance. I encouraged Mom to bring the Supple Cups with her when she comes to have the baby.  Glenetta Hew, RN, IBCLC

## 2016-07-18 ENCOUNTER — Inpatient Hospital Stay (HOSPITAL_COMMUNITY)
Admission: AD | Admit: 2016-07-18 | Discharge: 2016-07-21 | DRG: 775 | Disposition: A | Discharge status: Home / Self Care | Payer: PRIVATE HEALTH INSURANCE | Source: Ambulatory Visit | Attending: Obstetrics and Gynecology | Admitting: Obstetrics and Gynecology

## 2016-07-18 ENCOUNTER — Encounter (HOSPITAL_COMMUNITY): Payer: Self-pay | Admitting: *Deleted

## 2016-07-18 DIAGNOSIS — K219 Gastro-esophageal reflux disease without esophagitis: Secondary | ICD-10-CM | POA: Diagnosis present

## 2016-07-18 DIAGNOSIS — Z3A39 39 weeks gestation of pregnancy: Secondary | ICD-10-CM

## 2016-07-18 DIAGNOSIS — O4202 Full-term premature rupture of membranes, onset of labor within 24 hours of rupture: Secondary | ICD-10-CM | POA: Diagnosis present

## 2016-07-18 DIAGNOSIS — O9962 Diseases of the digestive system complicating childbirth: Secondary | ICD-10-CM | POA: Diagnosis present

## 2016-07-18 HISTORY — DX: Irritable bowel syndrome, unspecified: K58.9

## 2016-07-18 NOTE — MAU Note (Addendum)
PT  SAYS SHE THINKS  SROM  AT 830PM-    DR  BOVARD  PRIMARY-   VE   1  CM  LAST THURS   .    DENIES HSV AND MRSA  . GBS- NEG        SOME  UC

## 2016-07-19 ENCOUNTER — Inpatient Hospital Stay (HOSPITAL_COMMUNITY): Payer: PRIVATE HEALTH INSURANCE | Admitting: Anesthesiology

## 2016-07-19 ENCOUNTER — Encounter (HOSPITAL_COMMUNITY): Payer: Self-pay | Admitting: *Deleted

## 2016-07-19 DIAGNOSIS — O9962 Diseases of the digestive system complicating childbirth: Secondary | ICD-10-CM | POA: Diagnosis present

## 2016-07-19 DIAGNOSIS — O4202 Full-term premature rupture of membranes, onset of labor within 24 hours of rupture: Secondary | ICD-10-CM | POA: Diagnosis present

## 2016-07-19 DIAGNOSIS — Z3A39 39 weeks gestation of pregnancy: Secondary | ICD-10-CM | POA: Diagnosis not present

## 2016-07-19 DIAGNOSIS — K219 Gastro-esophageal reflux disease without esophagitis: Secondary | ICD-10-CM | POA: Diagnosis present

## 2016-07-19 LAB — CBC
HCT: 36.1 % (ref 36.0–46.0)
HCT: 33.2 % — ABNORMAL LOW (ref 36.0–46.0)
HCT: 32.6 % — ABNORMAL LOW (ref 36.0–46.0)
HEMOGLOBIN: 11.1 g/dL — AB (ref 12.0–15.0)
Hemoglobin: 12.2 g/dL (ref 12.0–15.0)
Hemoglobin: 11.4 g/dL — ABNORMAL LOW (ref 12.0–15.0)
MCH: 29.8 pg (ref 26.0–34.0)
MCH: 30.3 pg (ref 26.0–34.0)
MCH: 30.2 pg (ref 26.0–34.0)
MCHC: 33.8 g/dL (ref 30.0–36.0)
MCHC: 34.3 g/dL (ref 30.0–36.0)
MCHC: 34 g/dL (ref 30.0–36.0)
MCV: 88.3 fL (ref 78.0–100.0)
MCV: 88.3 fL (ref 78.0–100.0)
MCV: 88.6 fL (ref 78.0–100.0)
PLATELETS: 127 10*3/uL — AB (ref 150–400)
PLATELETS: 110 10*3/uL — AB (ref 150–400)
Platelets: 120 10*3/uL — ABNORMAL LOW (ref 150–400)
RBC: 4.09 MIL/uL (ref 3.87–5.11)
RBC: 3.76 MIL/uL — AB (ref 3.87–5.11)
RBC: 3.68 MIL/uL — AB (ref 3.87–5.11)
RDW: 13.7 % (ref 11.5–15.5)
RDW: 13.8 % (ref 11.5–15.5)
RDW: 13.7 % (ref 11.5–15.5)
WBC: 8.3 10*3/uL (ref 4.0–10.5)
WBC: 9.7 10*3/uL (ref 4.0–10.5)
WBC: 11.5 10*3/uL — AB (ref 4.0–10.5)

## 2016-07-19 LAB — AMNISURE RUPTURE OF MEMBRANE (ROM) NOT AT ARMC: Amnisure ROM: POSITIVE

## 2016-07-19 LAB — POCT FERN TEST: POCT Fern Test: NEGATIVE

## 2016-07-19 LAB — RPR: RPR: NONREACTIVE

## 2016-07-19 MED ORDER — OXYCODONE-ACETAMINOPHEN 5-325 MG PO TABS: 1.0000 | ORAL_TABLET | ORAL | Status: DC | PRN

## 2016-07-19 MED ORDER — ACETAMINOPHEN 325 MG PO TABS: 650.0000 mg | ORAL_TABLET | ORAL | Status: DC | PRN

## 2016-07-19 MED ORDER — PHENYLEPHRINE 40 MCG/ML (10ML) SYRINGE FOR IV PUSH (FOR BLOOD PRESSURE SUPPORT)
80.0000 ug | PREFILLED_SYRINGE | INTRAVENOUS | Status: DC | PRN
  Filled 2016-07-19: qty 5

## 2016-07-19 MED ORDER — OXYTOCIN 40 UNITS IN LACTATED RINGERS INFUSION - SIMPLE MED: 2.5000 [IU]/h | INTRAVENOUS | Status: DC

## 2016-07-19 MED ORDER — LIDOCAINE HCL (PF) 1 % IJ SOLN
INTRAMUSCULAR | Status: DC | PRN
  Administered 2016-07-19 (×2): 4 mL via EPIDURAL

## 2016-07-19 MED ORDER — FENTANYL 2.5 MCG/ML BUPIVACAINE 1/10 % EPIDURAL INFUSION (WH - ANES)
14.0000 mL/h | INTRAMUSCULAR | Status: DC | PRN
  Administered 2016-07-19: 14 mL/h via EPIDURAL

## 2016-07-19 MED ORDER — LACTATED RINGERS IV SOLN: 500.0000 mL | INTRAVENOUS | Status: DC | PRN

## 2016-07-19 MED ORDER — BUTORPHANOL TARTRATE 1 MG/ML IJ SOLN
1.0000 mg | INTRAMUSCULAR | Status: DC | PRN
  Administered 2016-07-19: 1 mg via INTRAVENOUS
  Filled 2016-07-19: qty 1

## 2016-07-19 MED ORDER — LACTATED RINGERS IV SOLN
INTRAVENOUS | Status: DC
  Administered 2016-07-19 (×3): via INTRAVENOUS

## 2016-07-19 MED ORDER — FLEET ENEMA 7-19 GM/118ML RE ENEM: 1.0000 | ENEMA | RECTAL | Status: DC | PRN

## 2016-07-19 MED ORDER — EPHEDRINE 5 MG/ML INJ
10.0000 mg | INTRAVENOUS | Status: DC | PRN
  Filled 2016-07-19: qty 2

## 2016-07-19 MED ORDER — DIPHENHYDRAMINE HCL 50 MG/ML IJ SOLN: 12.5000 mg | INTRAMUSCULAR | Status: DC | PRN

## 2016-07-19 MED ORDER — PHENYLEPHRINE 40 MCG/ML (10ML) SYRINGE FOR IV PUSH (FOR BLOOD PRESSURE SUPPORT)
PREFILLED_SYRINGE | INTRAVENOUS | Status: AC
  Filled 2016-07-19: qty 20

## 2016-07-19 MED ORDER — OXYTOCIN BOLUS FROM INFUSION: 500.0000 mL | Freq: Once | INTRAVENOUS | Status: DC

## 2016-07-19 MED ORDER — TERBUTALINE SULFATE 1 MG/ML IJ SOLN
0.2500 mg | Freq: Once | INTRAMUSCULAR | Status: DC | PRN
  Filled 2016-07-19: qty 1

## 2016-07-19 MED ORDER — LACTATED RINGERS IV SOLN
500.0000 mL | Freq: Once | INTRAVENOUS | Status: AC
  Administered 2016-07-19: 500 mL via INTRAVENOUS

## 2016-07-19 MED ORDER — SOD CITRATE-CITRIC ACID 500-334 MG/5ML PO SOLN: 30.0000 mL | ORAL | Status: DC | PRN

## 2016-07-19 MED ORDER — ONDANSETRON HCL 4 MG/2ML IJ SOLN
4.0000 mg | Freq: Four times a day (QID) | INTRAMUSCULAR | Status: DC | PRN
  Administered 2016-07-19: 4 mg via INTRAVENOUS
  Filled 2016-07-19: qty 2

## 2016-07-19 MED ORDER — OXYCODONE-ACETAMINOPHEN 5-325 MG PO TABS: 2.0000 | ORAL_TABLET | ORAL | Status: DC | PRN

## 2016-07-19 MED ORDER — LIDOCAINE HCL (PF) 1 % IJ SOLN
30.0000 mL | INTRAMUSCULAR | Status: DC | PRN
  Filled 2016-07-19: qty 30

## 2016-07-19 MED ORDER — FENTANYL 2.5 MCG/ML BUPIVACAINE 1/10 % EPIDURAL INFUSION (WH - ANES)
INTRAMUSCULAR | Status: AC
  Filled 2016-07-19: qty 100

## 2016-07-19 MED ORDER — OXYTOCIN 40 UNITS IN LACTATED RINGERS INFUSION - SIMPLE MED
1.0000 m[IU]/min | INTRAVENOUS | Status: DC
  Administered 2016-07-19: 2 m[IU]/min via INTRAVENOUS
  Filled 2016-07-19: qty 1000

## 2016-07-19 NOTE — Progress Notes (Signed)
Patient ID: Stacey Mccullough, female   DOB: 1990/09/17, 26 y.o.   MRN: [id number] Pt doing well. S/P epidural - mild nausea Plts dropped to 110- will monitor SVE 6/100/-1 Pit at  A/P: Continue with expected mgmt         Anticipate svd

## 2016-07-19 NOTE — Anesthesia Preprocedure Evaluation (Signed)
Anesthesia Evaluation  Patient identified by MRN, date of birth, ID band Patient awake    Reviewed: Allergy & Precautions, Patient's Chart, lab work & pertinent test results  Airway Mallampati: II  TM Distance: >3 FB Neck ROM: Full    Dental no notable dental hx. (+) Teeth Intact   Pulmonary neg pulmonary ROS,    Pulmonary exam normal breath sounds clear to auscultation- rhonchi       Cardiovascular negative cardio ROS Normal cardiovascular exam Rhythm:Regular Rate:Normal     Neuro/Psych negative neurological ROS  negative psych ROS   GI/Hepatic Neg liver ROS, GERD  Medicated and Controlled,  Endo/Other  negative endocrine ROS  Renal/GU negative Renal ROS  negative genitourinary   Musculoskeletal negative musculoskeletal ROS (+)   Abdominal   Peds  Hematology  (+) anemia , Thrombocytopenia Hx/o Lyme's disease   Anesthesia Other Findings   Reproductive/Obstetrics (+) Pregnancy                             Anesthesia Physical Anesthesia Plan  ASA: II  Anesthesia Plan: Epidural   Post-op Pain Management:    Induction:   Airway Management Planned: Natural Airway  Additional Equipment:   Intra-op Plan:   Post-operative Plan:   Informed Consent: I have reviewed the patients History and Physical, chart, labs and discussed the procedure including the risks, benefits and alternatives for the proposed anesthesia with the patient or authorized representative who has indicated his/her understanding and acceptance.     Plan Discussed with: Anesthesiologist  Anesthesia Plan Comments:         Anesthesia Quick Evaluation

## 2016-07-19 NOTE — Anesthesia Pain Management Evaluation Note (Signed)
  CRNA Pain Management Visit Note  Patient: Stacey Mccullough, 26 y.o., female  "Hello I am a member of the anesthesia team at [facility]. We have an anesthesia team available at all times to provide care throughout the hospital, including epidural management and anesthesia for C-section. I don't know your plan for the delivery whether it a natural birth, water birth, IV sedation, nitrous supplementation, doula or epidural, but we want to meet your pain goals."   1.Was your pain managed to your expectations on prior hospitalizations?   No prior hospitalizations  2.What is your expectation for pain management during this hospitalization?     Epidural  3.How can we help you reach that goal?   Record the patient's initial score and the patient's pain goal.   Pain: 3  Pain Goal: 6 [facility] wants you to be able to say your pain was always managed very well.  Laban Emperor 07/19/2016

## 2016-07-19 NOTE — Progress Notes (Signed)
Patient ID: Stacey Mccullough, female   DOB: Jul 28, 1990, 28 y.o.   MRN: [id number] Per nurse pt requesting pain medication; not ready for epidural May have stadol  q 3hrs prn Epidural prn Continue with expectant mgmt

## 2016-07-19 NOTE — Anesthesia Procedure Notes (Addendum)
Epidural Patient location during procedure: OB Start time: 07/19/2016 4:58 PM  Staffing Anesthesiologist: Mal Amabile Performed: anesthesiologist   Preanesthetic Checklist Completed: patient identified, site marked, surgical consent, pre-op evaluation, timeout performed, IV checked, risks and benefits discussed and monitors and equipment checked  Epidural Patient position: sitting Prep: site prepped and draped and DuraPrep Patient monitoring: continuous pulse ox and blood pressure Approach: midline Location: L3-L4 Injection technique: LOR air  Needle:  Needle type: Tuohy  Needle gauge: 17 G Needle length: 9 cm and 9 Needle insertion depth: 4 cm Catheter type: closed end flexible Catheter size: 19 Gauge Catheter at skin depth: 9 cm Test dose: negative and Other  Assessment Events: blood not aspirated, injection not painful, no injection resistance, negative IV test and no paresthesia  Additional Notes Patient identified. Risks and benefits discussed including failed block, incomplete  Pain control, post dural puncture headache, nerve damage, paralysis, blood pressure Changes, nausea, vomiting, reactions to medications-both toxic and allergic and post Partum back pain. All questions were answered. Patient expressed understanding and wished to proceed. Sterile technique was used throughout procedure. Epidural site was Dressed with sterile barrier dressing. No paresthesias, signs of intravascular injection Or signs of intrathecal spread were encountered.  Patient was more comfortable after the epidural was dosed. Please see RN's note for documentation of vital signs and FHR which are stable.

## 2016-07-19 NOTE — H&P (Signed)
Stacey Mccullough is a 26 y.o. female, G1P0, EGA 39+ weeks with EDC 4-25 presenting for leaking fluid since around 2030 last pm.  She was seen in MAU early this am, Amnisure positive, no regular ctx.  She was admitted and started on pitocin.  Prenatal care essentially uncomplicated.  OB History    Gravida Para Term Preterm AB Living   1             SAB TAB Ectopic Multiple Live Births                 Past Medical History:  Diagnosis Date  . Chronic epigastric pain    since Dec/comes and goes past eating  . IBS (irritable bowel syndrome)   . Lyme disease 1994  . Nausea    Past Surgical History:  Procedure Laterality Date  . COLONOSCOPY N/A 06/21/2013   Procedure: COLONOSCOPY;  Surgeon: Iva Boop, MD;  Location: [facility];  Service: Endoscopy;  Laterality: N/A;  . TONSILLECTOMY    . TONSILLECTOMY AND ADENOIDECTOMY  2007  . UPPER GASTROINTESTINAL ENDOSCOPY    . WISDOM TOOTH EXTRACTION     Family History: family history includes Asthma in her brother and sister; Cancer in her paternal uncle; Hypertension in her mother; Stroke in her mother. Social History:  reports that she has never smoked. She has never used smokeless tobacco. She reports that she does not drink alcohol or use drugs.     Maternal Diabetes: No Genetic Screening: Declined Maternal Ultrasounds/Referrals: Normal Fetal Ultrasounds or other Referrals:  None Maternal Substance Abuse:  No Significant Maternal Medications:  None Significant Maternal Lab Results:  Lab values include: Group B Strep negative Other Comments:  None  Review of Systems  Respiratory: Negative.   Cardiovascular: Negative.    Maternal Medical History:  Reason for admission: Rupture of membranes.   Contractions: Frequency: irregular.   Perceived severity is mild.    Fetal activity: Perceived fetal activity is normal.    Prenatal complications: no prenatal complications Prenatal Complications - Diabetes: none.    Dilation:  1.5 Effacement (%): 70 Station: -3 Exam by:: E. siska, RN Blood pressure 123/73, pulse 89, temperature 98.5 F (36.9 C), temperature source Oral, resp. rate 16, height 5' (1.524 m), weight 64 kg (141 lb). Maternal Exam:  Uterine Assessment: Contraction strength is mild.  Contraction frequency is regular.   Abdomen: Patient reports no abdominal tenderness. Estimated fetal weight is 7 lbs.   Fetal presentation: vertex  Introitus: Normal vulva. Normal vagina.  Amniotic fluid character: clear.  Pelvis: adequate for delivery.   Cervix: Cervix evaluated by digital exam.     Fetal Exam Fetal Monitor Review: Mode: ultrasound.   Baseline rate: 120s.  Variability: moderate (6-25 bpm).   Pattern: accelerations present and no decelerations.    Fetal State Assessment: Category I - tracings are normal.     Physical Exam  Vitals reviewed. Constitutional: She appears well-developed and well-nourished.  Cardiovascular: Normal rate and regular rhythm.   Respiratory: Effort normal. No respiratory distress.  GI: Soft.    Prenatal labs: ABO, Rh: --/--/O POS (04/24 0130) Antibody: POS (04/24 0130) Rubella: Immune (09/11 0000) RPR: Nonreactive (09/11 0000)  HBsAg: Negative (09/11 0000)  HIV: Non-reactive (09/11 0000)  GBS: Negative (03/25 0000)   Assessment/Plan: IUP at 39+ weeks with PROM.  Pt admitted, on pitocin induction.  Will monitor progress, anticipate SVD.     Stacey Mccullough Erinn Mendosa 07/19/2016, 6:45 AM

## 2016-07-19 NOTE — Progress Notes (Signed)
Patient ID: Stacey Mccullough, female   DOB: 1991/01/06, 26 y.o.   MRN: [id number] Pt doing well on pit at ; rates ctxs as "tolerable"  VSS EFM - cat 1, 125 TOCO - ctxs q 3-50mins SVE 3/90/-2 ; less posterior  A/P prime progresing in labor on pit        SROM but thin membrane noted; ruptured-scant fluid           Continue with expectant mgmt           Pain control prn

## 2016-07-19 NOTE — Progress Notes (Signed)
Patient ID: Stacey Mccullough, female   DOB: February 12, 1991, 26 y.o.   MRN: [id number] Pt doing well - rates contractions at 2/10.  VSS EFM - 120s, cat 1 TOCO - irregular ctxs q 2-19mins SVE - 2-3cm/80/-2  8.3>12.2<127  Pit at  A/P: Prime at39 6/[redacted]wks ga progressing into active labor with pitocin          Continue with pitocin per protocol         Recheck in 2-3hours; monitor plts         GBS neg         Anticipate svd

## 2016-07-20 LAB — CBC
HEMATOCRIT: 29.4 % — AB (ref 36.0–46.0)
Hemoglobin: 10.1 g/dL — ABNORMAL LOW (ref 12.0–15.0)
MCH: 30.3 pg (ref 26.0–34.0)
MCHC: 34.4 g/dL (ref 30.0–36.0)
MCV: 88.3 fL (ref 78.0–100.0)
PLATELETS: 102 10*3/uL — AB (ref 150–400)
RBC: 3.33 MIL/uL — ABNORMAL LOW (ref 3.87–5.11)
RDW: 13.8 % (ref 11.5–15.5)
WBC: 10.1 10*3/uL (ref 4.0–10.5)

## 2016-07-20 MED ORDER — DIPHENHYDRAMINE HCL 25 MG PO CAPS: 25.0000 mg | ORAL_CAPSULE | Freq: Four times a day (QID) | ORAL | Status: DC | PRN

## 2016-07-20 MED ORDER — PRENATAL MULTIVITAMIN CH
1.0000 | ORAL_TABLET | Freq: Every day | ORAL | Status: DC
  Administered 2016-07-20 – 2016-07-21 (×2): 1 via ORAL
  Filled 2016-07-20 (×2): qty 1

## 2016-07-20 MED ORDER — DIBUCAINE 1 % RE OINT: 1.0000 "application " | TOPICAL_OINTMENT | RECTAL | Status: DC | PRN

## 2016-07-20 MED ORDER — BENZOCAINE-MENTHOL 20-0.5 % EX AERO
1.0000 "application " | INHALATION_SPRAY | CUTANEOUS | Status: DC | PRN
  Administered 2016-07-20 – 2016-07-21 (×2): 1 via TOPICAL
  Filled 2016-07-20 (×2): qty 56

## 2016-07-20 MED ORDER — SENNOSIDES-DOCUSATE SODIUM 8.6-50 MG PO TABS
2.0000 | ORAL_TABLET | ORAL | Status: DC
  Administered 2016-07-20 – 2016-07-21 (×2): 2 via ORAL
  Filled 2016-07-20 (×2): qty 2

## 2016-07-20 MED ORDER — COCONUT OIL OIL
1.0000 "application " | TOPICAL_OIL | Status: DC | PRN
  Administered 2016-07-20: 1 via TOPICAL
  Filled 2016-07-20: qty 120

## 2016-07-20 MED ORDER — WITCH HAZEL-GLYCERIN EX PADS: 1.0000 "application " | MEDICATED_PAD | CUTANEOUS | Status: DC | PRN

## 2016-07-20 MED ORDER — IBUPROFEN 600 MG PO TABS
600.0000 mg | ORAL_TABLET | Freq: Four times a day (QID) | ORAL | Status: DC
  Administered 2016-07-20 – 2016-07-21 (×5): 600 mg via ORAL
  Filled 2016-07-20 (×6): qty 1

## 2016-07-20 MED ORDER — TETANUS-DIPHTH-ACELL PERTUSSIS 5-2.5-18.5 LF-MCG/0.5 IM SUSP: 0.5000 mL | Freq: Once | INTRAMUSCULAR | Status: DC

## 2016-07-20 MED ORDER — ACETAMINOPHEN 325 MG PO TABS: 650.0000 mg | ORAL_TABLET | ORAL | Status: DC | PRN

## 2016-07-20 MED ORDER — ONDANSETRON HCL 4 MG PO TABS: 4.0000 mg | ORAL_TABLET | ORAL | Status: DC | PRN

## 2016-07-20 MED ORDER — ONDANSETRON HCL 4 MG/2ML IJ SOLN: 4.0000 mg | INTRAMUSCULAR | Status: DC | PRN

## 2016-07-20 MED ORDER — ZOLPIDEM TARTRATE 5 MG PO TABS: 5.0000 mg | ORAL_TABLET | Freq: Every evening | ORAL | Status: DC | PRN

## 2016-07-20 MED ORDER — SIMETHICONE 80 MG PO CHEW: 80.0000 mg | CHEWABLE_TABLET | ORAL | Status: DC | PRN

## 2016-07-20 NOTE — Progress Notes (Signed)
Nurse at bedside .  Nurse noted pt to be wearing nipple puller by [employer] company to assist with bilateral inverted nipples. Pt also showed nurse size 24 nipple shield by [employer] as well.  Pt informed nurse that she has spoken with LC on telephone for 20 minutes earlier this pregnancy.   Nurse noted pt to have infant latched to left breast w/o nipple shield or nipple puller.  Pt given nipple shield size 16 per request.  Pt offered hand pump and double electric pump.  Pt refused at this time.  States "I want to continue with what I am currently using.".  Sig other at bedside.

## 2016-07-20 NOTE — Lactation Note (Signed)
This note was copied from a baby's chart. Lactation Consultation Note  Patient Name: Stacey Mccullough [id number] Date: 07/20/2016 Reason for consult: Initial assessment  Baby 15 hours old. Mom has room full of family and small children. Mom reports that she thinks the baby is latching well, but she may need some assistance later. Enc mom to call her bedside nurse for assistance. Mom given [facility] brochure, aware of OP/BFSG and LC phone line assistance after D/C.   Maternal Data    Feeding    LATCH Score/Interventions                      Lactation Tools Discussed/Used     Consult Status Consult Status: Follow-up Date: 07/21/16 Follow-up type: In-patient    Sherlyn Hay 07/20/2016, 12:54 PM

## 2016-07-20 NOTE — Progress Notes (Signed)
Post Partum Day 1 Subjective: no complaints, up ad lib, voiding, tolerating PO and nl lochia, pain controlled  Objective: Blood pressure 124/78, pulse (!) 105, temperature 98.6 F (37 C), resp. rate 18, height 5' (1.524 m), weight 64 kg (141 lb), SpO2 98 %, unknown if currently breastfeeding.  Physical Exam:  General: alert and no distress Lochia: appropriate Uterine Fundus: firm  Recent Labs  07/19/16 1606 07/19/16 2151  HGB 11.4* 11.1*  HCT 33.2* 32.6*    Assessment/Plan: Plan for discharge tomorrow, Breastfeeding and Lactation consult.  Routine PP care.     LOS: 1 day   Ashaz Robling Bovard-Stuckert 07/20/2016, 7:22 AM

## 2016-07-20 NOTE — Anesthesia Postprocedure Evaluation (Signed)
Anesthesia Post Note  Patient: Stacey Mccullough  Procedure(s) Performed: * No procedures listed *  Patient location during evaluation: Mother Baby Anesthesia Type: Epidural Level of consciousness: awake and alert and oriented Pain management: pain level controlled Vital Signs Assessment: post-procedure vital signs reviewed and stable Respiratory status: spontaneous breathing and nonlabored ventilation Cardiovascular status: stable Postop Assessment: no headache, no backache, epidural receding, patient able to bend at knees, no signs of nausea or vomiting and adequate PO intake Anesthetic complications: no        Last Vitals:  Vitals:   07/20/16 0030 07/20/16 0430  BP: 113/75 124/78  Pulse: (!) 102 (!) 105  Resp: 18 18  Temp:      Last Pain:  Vitals:   07/19/16 2215  TempSrc:   PainSc: 0-No pain   Pain Goal: Patients Stated Pain Goal: 2 (07/19/16 0422)               Laban Emperor

## 2016-07-21 MED ORDER — IBUPROFEN 600 MG PO TABS: 600.0000 mg | ORAL_TABLET | Freq: Four times a day (QID) | ORAL | 0 refills | Status: DC

## 2016-07-21 NOTE — Progress Notes (Signed)
Post Partum Day 2 Subjective: no complaints except tired from baby cluster feeding  Objective: Blood pressure 105/68, pulse 69, temperature 98.2 F (36.8 C), temperature source Oral, resp. rate 18, height 5' (1.524 m), weight 64 kg (141 lb), SpO2 98 %, unknown if currently breastfeeding.  Physical Exam:  General: alert and cooperative Lochia: appropriate Uterine Fundus: firm    Recent Labs  07/19/16 2151 07/20/16 0654  HGB 11.1* 10.1*  HCT 32.6* 29.4*    Assessment/Plan: Discharge home     LOS: 2 days   Oliver Pila 07/21/2016, 9:02 AM

## 2016-07-21 NOTE — Discharge Summary (Signed)
OB Discharge Summary     Patient Name: Stacey Mccullough DOB: May 02, 1990 MRN: [id number]  Date of admission: 07/18/2016 Delivering MD: Pryor Ochoa [facility]   Date of discharge: 07/21/2016  Admitting diagnosis: 39.5 WEEKS LEAKING FLUID Intrauterine pregnancy: [redacted]w[redacted]d     Secondary diagnosis:  Active Problems:   Normal labor   SVD (spontaneous vaginal delivery)   Postpartum care following vaginal delivery  Additional problems: none     Discharge diagnosis: Term Pregnancy Delivered                                                                                                Post partum procedures:none  Augmentation: Pitocin  Complications: None  Hospital course:  Onset of Labor With Vaginal Delivery     26 y.o. yo G1P1001 at [redacted]w[redacted]d was admitted in Latent Labor on 07/18/2016. Patient had an uncomplicated labor course as follows:  Membrane Rupture Time/Date: 8:30 PM ,07/18/2016   Intrapartum Procedures: Episiotomy: None [1]                                         Lacerations:  2nd degree [3];Perineal [11]  Patient had a delivery of a Viable infant. 07/19/2016  Information for the patient's newborn:  Nusaiba, Guallpa [[id number]]  Delivery Method: Vaginal, Spontaneous Delivery (Filed from Delivery Summary)    Pateint had an uncomplicated postpartum course.  She is ambulating, tolerating a regular diet, passing flatus, and urinating well. Patient is discharged home in stable condition on 07/21/16.   Physical exam  Vitals:   07/20/16 0430 07/20/16 1050 07/20/16 1919 07/21/16 0700  BP: 124/78 100/60 116/71 105/68  Pulse: (!) 105 95 81 69  Resp: Temp:  98.6 F (37 C) 98.2 F (36.8 C)   TempSrc:  Oral Oral   SpO2:      Weight:      Height:       General: alert and cooperative Lochia: appropriate Uterine Fundus: firm } Labs: Lab Results  Component Value Date   WBC 10.1 07/20/2016   HGB 10.1 (L) 07/20/2016   HCT 29.4 (L) 07/20/2016   MCV 88.3 07/20/2016   PLT  102 (L) 07/20/2016   CMP Latest Ref Rng & Units 05/29/2013  Glucose 70 - 99 mg/dL -  BUN 6 - 23 mg/dL -  Creatinine 0.4 - 1.2 mg/dL -  Sodium 782 - 956 mEq/L -  Potassium 3.5 - 5.1 mEq/L -  Chloride 96 - 112 mEq/L -  CO2 19 - 32 mEq/L -  Calcium 8.4 - 10.5 mg/dL -  Total Protein 6.0 - 8.3 g/dL 6.7  Total Bilirubin 0.3 - 1.2 mg/dL 1.0  Alkaline Phos 39 - 117 U/L 56  AST 0 - 37 U/L 34  ALT 0 - 35 U/L 53(H)    Discharge instruction: per After Visit Summary and "Baby and Me Booklet".  After visit meds:  Allergies as of 07/21/2016   No Known Allergies     Medication List  TAKE these medications   calcium carbonate 500 MG chewable tablet Commonly known as:  TUMS - dosed in mg elemental calcium Chew 2 tablets by mouth 2 (two) times daily as needed for indigestion or heartburn.   diphenhydrAMINE 25 MG tablet Commonly known as:  BENADRYL Take 25 mg by mouth at bedtime as needed for sleep.   ibuprofen 600 MG tablet Commonly known as:  ADVIL,MOTRIN Take 1 tablet (600 mg total) by mouth every 6 (six) hours.   multivitamin-prenatal 27-0.8 MG Tabs tablet Take 1 tablet by mouth daily at 12 noon.   TYLENOL 500 MG tablet Generic drug:  acetaminophen Take 500 mg by mouth as needed.       Diet: routine diet  Activity: Advance as tolerated. Pelvic rest for 6 weeks.   Outpatient follow up:6 weeks Follow up Appt:No future appointments. Follow up Visit:No Follow-up on file.  Postpartum contraception: Condoms and Undecided  Newborn Data: Live born female  Birth Weight: 6 lb 11.2 oz (3039 g) APGAR: 9, 9  Baby Feeding: Breast Disposition:home with mother   07/21/2016 Oliver Pila, MD

## 2016-07-23 LAB — TYPE AND SCREEN
ABO/RH(D): O POS
ANTIBODY SCREEN: POSITIVE
DAT, IgG: NEGATIVE
Donor AG Type: NEGATIVE
Donor AG Type: NEGATIVE
PT AG Type: NEGATIVE
UNIT DIVISION: 0
Unit division: 0

## 2016-07-23 LAB — BPAM RBC
BLOOD PRODUCT EXPIRATION DATE: 201805052359
BLOOD PRODUCT EXPIRATION DATE: 201805072359
UNIT TYPE AND RH: 5100
Unit Type and Rh: 5100

## 2017-07-15 ENCOUNTER — Emergency Department (HOSPITAL_COMMUNITY)
Admission: EM | Admit: 2017-07-15 | Discharge: 2017-07-15 | Disposition: A | Discharge status: Home / Self Care | Payer: PRIVATE HEALTH INSURANCE | Attending: Emergency Medicine | Admitting: Emergency Medicine

## 2017-07-15 ENCOUNTER — Emergency Department (HOSPITAL_COMMUNITY): Payer: PRIVATE HEALTH INSURANCE

## 2017-07-15 ENCOUNTER — Other Ambulatory Visit: Payer: Self-pay

## 2017-07-15 DIAGNOSIS — R110 Nausea: Secondary | ICD-10-CM | POA: Diagnosis not present

## 2017-07-15 DIAGNOSIS — K5900 Constipation, unspecified: Secondary | ICD-10-CM | POA: Diagnosis not present

## 2017-07-15 DIAGNOSIS — R6883 Chills (without fever): Secondary | ICD-10-CM | POA: Insufficient documentation

## 2017-07-15 DIAGNOSIS — R1031 Right lower quadrant pain: Secondary | ICD-10-CM | POA: Diagnosis present

## 2017-07-15 DIAGNOSIS — Z79899 Other long term (current) drug therapy: Secondary | ICD-10-CM | POA: Insufficient documentation

## 2017-07-15 LAB — URINALYSIS, ROUTINE W REFLEX MICROSCOPIC
Bilirubin Urine: NEGATIVE
Glucose, UA: NEGATIVE mg/dL
HGB URINE DIPSTICK: NEGATIVE
Ketones, ur: 80 mg/dL — AB
Leukocytes, UA: NEGATIVE
NITRITE: NEGATIVE
PH: 5 (ref 5.0–8.0)
Protein, ur: NEGATIVE mg/dL
SPECIFIC GRAVITY, URINE: 1.029 (ref 1.005–1.030)

## 2017-07-15 LAB — CBG MONITORING, ED: GLUCOSE-CAPILLARY: 80 mg/dL (ref 65–99)

## 2017-07-15 LAB — COMPREHENSIVE METABOLIC PANEL
ALBUMIN: 4.5 g/dL (ref 3.5–5.0)
ALK PHOS: 101 U/L (ref 38–126)
ALT: 16 U/L (ref 14–54)
ANION GAP: 16 — AB (ref 5–15)
AST: 22 U/L (ref 15–41)
BILIRUBIN TOTAL: 1.2 mg/dL (ref 0.3–1.2)
BUN: 17 mg/dL (ref 6–20)
CALCIUM: 9.4 mg/dL (ref 8.9–10.3)
CO2: 21 mmol/L — AB (ref 22–32)
Chloride: 102 mmol/L (ref 101–111)
Creatinine, Ser: 0.68 mg/dL (ref 0.44–1.00)
GFR calc Af Amer: >60 mL/min (ref >60)
GFR calc non Af Amer: >60 mL/min (ref >60)
GLUCOSE: 61 mg/dL — AB (ref 65–99)
Potassium: 4 mmol/L (ref 3.5–5.1)
SODIUM: 139 mmol/L (ref 135–145)
TOTAL PROTEIN: 8.1 g/dL (ref 6.5–8.1)

## 2017-07-15 LAB — CBC
HCT: 40.8 % (ref 36.0–46.0)
HEMOGLOBIN: 13.8 g/dL (ref 12.0–15.0)
MCH: 30.1 pg (ref 26.0–34.0)
MCHC: 33.8 g/dL (ref 30.0–36.0)
MCV: 88.9 fL (ref 78.0–100.0)
Platelets: 226 10*3/uL (ref 150–400)
RBC: 4.59 MIL/uL (ref 3.87–5.11)
RDW: 12.3 % (ref 11.5–15.5)
WBC: 5.5 10*3/uL (ref 4.0–10.5)

## 2017-07-15 LAB — POC URINE PREG, ED: Preg Test, Ur: NEGATIVE

## 2017-07-15 LAB — LIPASE, BLOOD: Lipase: 21 U/L (ref 11–51)

## 2017-07-15 MED ORDER — IOPAMIDOL (ISOVUE-300) INJECTION 61%
100.0000 mL | Freq: Once | INTRAVENOUS | Status: AC | PRN
  Administered 2017-07-15: 100 mL via INTRAVENOUS

## 2017-07-15 MED ORDER — ONDANSETRON 4 MG PO TBDP: 4.0000 mg | ORAL_TABLET | Freq: Three times a day (TID) | ORAL | 0 refills | Status: DC | PRN

## 2017-07-15 MED ORDER — IOPAMIDOL (ISOVUE-300) INJECTION 61%
30.0000 mL | Freq: Once | INTRAVENOUS | Status: AC | PRN
  Administered 2017-07-15: 30 mL via ORAL

## 2017-07-15 MED ORDER — IOPAMIDOL (ISOVUE-300) INJECTION 61%
INTRAVENOUS | Status: AC
  Filled 2017-07-15: qty 30

## 2017-07-15 MED ORDER — SODIUM CHLORIDE 0.9 % IV BOLUS
1000.0000 mL | Freq: Once | INTRAVENOUS | Status: AC
  Administered 2017-07-15: 1000 mL via INTRAVENOUS

## 2017-07-15 MED ORDER — ONDANSETRON HCL 4 MG/2ML IJ SOLN
4.0000 mg | Freq: Once | INTRAMUSCULAR | Status: AC
  Administered 2017-07-15: 4 mg via INTRAVENOUS
  Filled 2017-07-15: qty 2

## 2017-07-15 MED ORDER — IOPAMIDOL (ISOVUE-300) INJECTION 61%
INTRAVENOUS | Status: AC
  Administered 2017-07-15: 19:00:00
  Filled 2017-07-15: qty 100

## 2017-07-15 MED ORDER — ACETAMINOPHEN 500 MG PO TABS
1000.0000 mg | ORAL_TABLET | Freq: Once | ORAL | Status: AC
  Administered 2017-07-15: 1000 mg via ORAL
  Filled 2017-07-15: qty 2

## 2017-07-15 NOTE — Discharge Instructions (Signed)
Let 1 tablet of Zofran dissolve under the tongue every 8 hours as needed for nausea and vomiting.   -For constipation: take 2 capfuls of Miralax by mouth mixed with 16 ounces of juice or gatorade once daily until you have an initial bowel movement.  -After your first bowel movement, take 1 capful of Miralax by mouth daily mixed with 16 ounces of juice or gatrorade until your bowel movements become regular and soft. If you experience diarrhea, you may decrease the daily dose by 1/2 capful as needed or stop taking miralax.   You can also try taking 4 ounces of prune juice and 4 ounces of grapefruit juice mixing [employer] hitting them up in the microwave for 30 seconds to try and improve your constipation.  Call the number on your discharge paperwork to get established with a primary care provider for follow-up.  For pain, you can take 600 mg of ibuprofen with food or 650 mg of Tylenol every 6 hours as needed for pain.  If you develop new or worsening symptoms including uncontrollable abdominal pain, a fever that does not improve with Tylenol, or if you develop vomiting despite taking Zofran, or other new concerning symptoms, please return to the emergency department for re-evaluation.

## 2017-07-15 NOTE — ED Triage Notes (Signed)
Pt reports lower abdominal pain. No BM in 2 days. Lack of appetite. Pt reports temp of 99.5. Pt reports she is currently breastfeeding. Pt reports her whole body aching.

## 2017-07-15 NOTE — ED Provider Notes (Signed)
[facility]-EMERGENCY DEPT Provider Note   CSN: [id number] Arrival date & time: 07/15/17  7829     History   Chief Complaint Chief Complaint  Patient presents with  . Abdominal Pain    HPI Stacey Mccullough is a 27 y.o. female with a h/o of IBS with diarrhea who presents to the emergency department with a chief complaint of abdominal pain.  The patient endorses right lower quadrant abdominal pain that began 2 days ago with gradual onset. States the pain began in the periumbilical area, but now is present in the RLQ . Radiates down to the right groin and around to her right low back. Worsening since onset. Characterizes the pain as constant, throbbing, crampy pain with waves of sharpness.  Pain is worse with eating and alleviated by nothing. No history of similar.  She also endorses nausea, anorexia, subjective fever, and chills for the last 2 days. She states " I think I would just feel better if I could have a bowel movement."  She also endorses feeling generally more weak and tired over the last few days.  Last bowel movement 2 to 3 days ago.  She reports that she is not passing flatus.  She has not eaten since yesterday morning when she had a bowl of broth. LMP July 2017.  She is currently sexually active with one female partner, her husband.  They use condoms for protection.  She had a vaginal delivery in April 2018 and is currently breast-feeding. She reports poor PO intake since onset of symptoms secondary to nausea.   She also endorses vaginal discharge.  States this is chronic, but has mildly increased over the last few days. No odor. She denies dyspareunia, vaginal pain, dysuria, hematuria, diarrhea, emesis, flank pain, chest pain, dyspnea, headache, or rash.   Colonoscopy in 2015 after having chronic GI issues that have since resolved.  No history of abdominal surgery.  No history of constipation.  She has no chronic medical problems and takes no daily medications.   She reports that she and her husband took a cruise last week and spent 1 day in [place] eating and drinking. No known sick contacts.   The history is provided by the patient. No language interpreter was used.  Abdominal Pain   Associated symptoms include fever, nausea and constipation. Pertinent negatives include diarrhea, vomiting, dysuria, headaches and myalgias.    Past Medical History:  Diagnosis Date  . Chronic epigastric pain    since Dec/comes and goes past eating  . IBS (irritable bowel syndrome)   . Lyme disease 1994  . Nausea     Patient Active Problem List   Diagnosis Date Noted  . Postpartum care following vaginal delivery 07/20/2016  . Normal labor 07/19/2016  . SVD (spontaneous vaginal delivery) 07/19/2016  . Abdominal pain, chronic, epigastric 06/21/2013  . Diarrhea 06/21/2013  . Chest pain at rest 11/14/2011  . Routine general medical examination at a health care facility 11/14/2011  . Decreased visual acuity 11/14/2011    Past Surgical History:  Procedure Laterality Date  . COLONOSCOPY N/A 06/21/2013   Procedure: COLONOSCOPY;  Surgeon: Iva Boop, MD;  Location: [facility];  Service: Endoscopy;  Laterality: N/A;  . TONSILLECTOMY    . TONSILLECTOMY AND ADENOIDECTOMY  2007  . UPPER GASTROINTESTINAL ENDOSCOPY    . WISDOM TOOTH EXTRACTION       OB History    Gravida  1   Para  1   Term  1  Preterm      AB      Living  1     SAB      TAB      Ectopic      Multiple  0   Live Births  1            Home Medications    Prior to Admission medications   Medication Sig Start Date End Date Taking? Authorizing Provider  acetaminophen (TYLENOL) 500 MG tablet Take 1,000 mg by mouth as needed for mild pain.    Yes [provider]  diphenhydrAMINE (BENADRYL) 25 MG tablet Take 25 mg by mouth at bedtime as needed for sleep.   Yes [provider]  docusate sodium (COLACE) 100 MG capsule Take 100 mg by mouth daily as needed  for mild constipation.   Yes [provider]  oxymetazoline (AFRIN) 0.05 % nasal spray Place 3 sprays into both nostrils daily.   Yes [provider]  Prenatal Vit-Fe Fumarate-FA (MULTIVITAMIN-PRENATAL) 27-0.8 MG TABS tablet Take 1 tablet by mouth daily at 12 noon.   Yes [provider]  ibuprofen (ADVIL,MOTRIN) 600 MG tablet Take 1 tablet (600 mg total) by mouth every 6 (six) hours. Patient not taking: Reported on 07/15/2017 07/21/16   Huel Coteichardson, Kathy, MD  ondansetron (ZOFRAN ODT) 4 MG disintegrating tablet Take 1 tablet (4 mg total) by mouth every 8 (eight) hours as needed for nausea or vomiting. 07/15/17   Javier Mamone A, PA-C    Family History Family History  Problem Relation Age of Onset  . Hypertension Mother   . Stroke Mother   . Asthma Brother        x 2  . Asthma Sister   . Cancer Paternal Uncle        Bone Sarcoma    Social History Social History   Tobacco Use  . Smoking status: Never Smoker  . Smokeless tobacco: Never Used  Substance Use Topics  . Alcohol use: No    Alcohol/week: 0.0 oz    Comment: soacial- 2 per month  . Drug use: No     Allergies   Patient has no known allergies.   Review of Systems Review of Systems  Constitutional: Positive for chills, fatigue and fever. Negative for activity change.  HENT: Negative for congestion and sore throat.   Eyes: Negative for visual disturbance.  Respiratory: Negative for shortness of breath.   Cardiovascular: Negative for chest pain.  Gastrointestinal: Positive for abdominal pain, constipation and nausea. Negative for diarrhea and vomiting.  Genitourinary: Positive for vaginal discharge. Negative for dysuria, flank pain, urgency and vaginal pain.  Musculoskeletal: Positive for back pain. Negative for myalgias and neck pain.  Skin: Negative for rash.  Allergic/Immunologic: Negative for immunocompromised state.  Neurological: Positive for weakness (generalized). Negative for  dizziness, numbness and headaches.  Psychiatric/Behavioral: Negative for confusion.     Physical Exam Updated Vital Signs BP 120/70 (BP Location: Left Arm)   Pulse 95   Temp (!) 97.4 F (36.3 C) (Oral)   Resp 18   Wt 49 kg (108 lb)   SpO2 97%   Breastfeeding? Yes Comment: Neg preg test 07/15/17  BMI 21.09 kg/m   Physical Exam  Constitutional:  Non-toxic appearance. She does not appear ill. No distress.  Uncomfortable appearing  HENT:  Head: Normocephalic.  Eyes: Conjunctivae are normal. No scleral icterus.  Neck: Neck supple.  Cardiovascular: Normal rate, regular rhythm, normal heart sounds and intact distal pulses. Exam reveals no  gallop and no friction rub.  No murmur heard. Pulmonary/Chest: Effort normal. No stridor. No respiratory distress. She has no wheezes. She has no rales. She exhibits no tenderness.  Abdominal: Soft. Normal appearance. She exhibits no distension and no mass. There is tenderness. There is no rebound and no guarding. No hernia.  Abdomen is soft, nondistended.  Mildly tender to palpation in the right lower quadrant with mild rebound. No guarding.  Moderately tender to palpation in the left upper and lower quadrants.  Right upper quadrant is nontender.  Negative Murphy sign.  She is tender over McBurney's point.  No CVA tenderness bilaterally.  Right-sided inguinal lymphadenopathy.  No left-sided.  Musculoskeletal: She exhibits no tenderness.  No reproducible tenderness to palpation in the musculature of the lumbar region bilaterally.  Spinous processes of the lumbar spine are non-tender.  Neurological: She is alert.  Skin: Skin is warm. Capillary refill takes less than 2 seconds. No rash noted. She is not diaphoretic.  Psychiatric: Her behavior is normal.  Nursing note and vitals reviewed.    ED Treatments / Results  Labs (all labs ordered are listed, but only abnormal results are displayed) Labs Reviewed  COMPREHENSIVE METABOLIC PANEL - Abnormal;  Notable for the following components:      Result Value   CO2 21 (*)    Glucose, Bld 61 (*)    Anion gap 16 (*)    All other components within normal limits  URINALYSIS, ROUTINE W REFLEX MICROSCOPIC - Abnormal; Notable for the following components:   APPearance HAZY (*)    Ketones, ur 80 (*)    Bacteria, UA FEW (*)    Squamous Epithelial / LPF 6-30 (*)    All other components within normal limits  LIPASE, BLOOD  CBC  POC URINE PREG, ED  CBG MONITORING, ED    EKG None  Radiology Ct Abdomen Pelvis W Contrast  Result Date: 07/15/2017 CLINICAL DATA:  27 y/o F; lower abdominal pain, no bowel movement in 2 days, lack of appetite. EXAM: CT ABDOMEN AND PELVIS WITH CONTRAST TECHNIQUE: Multidetector CT imaging of the abdomen and pelvis was performed using the standard protocol following bolus administration of intravenous contrast. CONTRAST:  ISOVUE-300 IOPAMIDOL (ISOVUE-300) INJECTION 61%, 30mL ISOVUE-300 IOPAMIDOL (ISOVUE-300) INJECTION 61% COMPARISON:  05/16/2013 CT abdomen and pelvis. FINDINGS: Lower chest: No acute abnormality. Hepatobiliary: No focal liver abnormality is seen. No gallstones, gallbladder wall thickening, or biliary dilatation. Pancreas: Unremarkable. No pancreatic ductal dilatation or surrounding inflammatory changes. Spleen: Normal in size without focal abnormality. Adrenals/Urinary Tract: Adrenal glands are unremarkable. Kidneys are normal, without renal calculi, focal lesion, or hydronephrosis. Bladder is unremarkable. Stomach/Bowel: Stomach is within normal limits. Appendix appears normal. No evidence of bowel wall thickening, distention, or inflammatory changes. Vascular/Lymphatic: No significant vascular findings are present. No enlarged abdominal or pelvic lymph nodes. Reproductive: Uterus and bilateral adnexa are unremarkable. Other: No abdominal wall hernia or abnormality. No abdominopelvic ascites. Musculoskeletal: No acute or significant osseous findings.  IMPRESSION: No acute process identified.  Negative CT of the abdomen and pelvis. Electronically Signed   By: Mitzi Hansen M.D.   On: 07/15/2017 18:44   Dg Abdomen Acute W/chest  Result Date: 07/15/2017 CLINICAL DATA:  Right lower abdominal pain and constipation for 2 days. EXAM: DG ABDOMEN ACUTE W/ 1V CHEST COMPARISON:  Chest radiographs 01/28/2005. CT abdomen and pelvis 05/16/2013. FINDINGS: The cardiomediastinal silhouette is within normal limits. The lungs are well inflated and clear. There is no evidence of pleural effusion or pneumothorax.  No acute osseous abnormality is identified. There is no evidence of intraperitoneal free air. A moderate amount of stool is present in the colon. No dilated loops of bowel are seen to suggest obstruction. No radiopaque urinary tract calculi are identified. IMPRESSION: 1. Moderate colonic stool volume without evidence of obstruction. 2. Clear lungs. Electronically Signed   By: Sebastian Ache M.D.   On: 07/15/2017 16:00    Procedures Procedures (including critical care time)  Medications Ordered in ED Medications  iopamidol (ISOVUE-300) 61 % injection (has no administration in time range)  sodium chloride 0.9 % bolus 1,000 mL (1,000 mLs Intravenous New Bag/Given 07/15/17 1600)  ondansetron (ZOFRAN) injection 4 mg (4 mg Intravenous Given 07/15/17 1600)  acetaminophen (TYLENOL) tablet 1,000 mg (1,000 mg Oral Given 07/15/17 1650)  iopamidol (ISOVUE-300) 61 % injection (  Contrast Given 07/15/17 1842)  iopamidol (ISOVUE-300) 61 % injection 100 mL (100 mLs Intravenous Contrast Given 07/15/17 1823)  iopamidol (ISOVUE-300) 61 % injection 30 mL (30 mLs Oral Contrast Given 07/15/17 1824)     Initial Impression / Assessment and Plan / ED Course  I have reviewed the triage vital signs and the nursing notes.  Pertinent labs & imaging results that were available during my care of the patient were reviewed by me and considered in my medical decision making (see  chart for details).     27 year old female with a history of IBS presenting with abdominal pain, nausea, anorexia, constipation, subjective fever, and chills.  Patient is nontoxic, nonseptic appearing, in no apparent distress.  Patient's pain and other symptoms adequately managed in emergency department.  Fluid bolus given.  Labs, imaging and vitals reviewed.  CBG 61 on arrival, improving to 80 after juice. Patient has not eaten since yesterday morning due to nausea and is currently breast feeding. Patient does not meet the SIRS or Sepsis criteria.  On repeat exam patient does not have a surgical abdomen and there are no peritoneal signs.  No indication of appendicitis, bowel obstruction, bowel perforation, cholecystitis, diverticulitis, PID or ectopic pregnancy.  Suspect constipation secondary to poor p.o. Intake coupled with breastfeeding. Patient discharged home with symptomatic treatment and given strict instructions for follow-up with their primary care physician.  I have also discussed reasons to return immediately to the ER.  Patient expresses understanding and agrees with plan.  Final Clinical Impressions(s) / ED Diagnoses   Final diagnoses:  Constipation, unspecified constipation type    ED Discharge Orders        Ordered    ondansetron (ZOFRAN ODT) 4 MG disintegrating tablet  Every 8 hours PRN     07/15/17 1915       Cree Kunert A, PA-C 07/15/17 1927    Charlynne Pander, MD 07/15/17 2337

## 2017-10-12 ENCOUNTER — Ambulatory Visit: Payer: PRIVATE HEALTH INSURANCE | Admitting: Internal Medicine

## 2017-10-12 ENCOUNTER — Ambulatory Visit: Payer: PRIVATE HEALTH INSURANCE | Admitting: Emergency Medicine

## 2017-10-12 ENCOUNTER — Encounter: Payer: Self-pay | Admitting: Emergency Medicine

## 2017-10-12 ENCOUNTER — Other Ambulatory Visit: Payer: Self-pay

## 2017-10-12 VITALS — BP 100/70 | HR 64 | Temp 98.8°F | Resp 16 | Ht 61.5 in | Wt 108.6 lb

## 2017-10-12 DIAGNOSIS — H9313 Tinnitus, bilateral: Secondary | ICD-10-CM

## 2017-10-12 DIAGNOSIS — R220 Localized swelling, mass and lump, head: Secondary | ICD-10-CM

## 2017-10-12 DIAGNOSIS — G44039 Episodic paroxysmal hemicrania, not intractable: Secondary | ICD-10-CM | POA: Diagnosis not present

## 2017-10-12 DIAGNOSIS — R51 Headache: Secondary | ICD-10-CM

## 2017-10-12 DIAGNOSIS — R519 Headache, unspecified: Secondary | ICD-10-CM

## 2017-10-12 NOTE — Patient Instructions (Addendum)
     IF you received an x-ray today, you will receive an invoice from [facility]. Please contact  Radiology at [phone] with questions or concerns regarding your invoice.   IF you received labwork today, you will receive an invoice from [facility]. Please contact [facility] at [phone] with questions or concerns regarding your invoice.   Our billing staff will not be able to assist you with questions regarding bills from these companies.  You will be contacted with the lab results as soon as they are available. The fastest way to get your results is to activate your My Chart account. Instructions are located on the last page of this paperwork. If you have not heard from us regarding the results in 2 weeks, please contact this office.     General Headache Without Cause A headache is pain or discomfort felt around the head or neck area. There are many causes and types of headaches. In some cases, the cause may not be found. Follow these instructions at home: Managing pain  Take over-the-counter and prescription medicines only as told by your doctor.  Lie down in a dark, quiet room when you have a headache.  If directed, apply ice to the head and neck area: ? Put ice in a plastic bag. ? Place a towel between your skin and the bag. ? Leave the ice on for 20 minutes, 2-3 times per day.  Use a heating pad or hot shower to apply heat to the head and neck area as told by your doctor.  Keep lights dim if bright lights bother you or make your headaches worse. Eating and drinking  Eat meals on a regular schedule.  Lessen how much alcohol you drink.  Lessen how much caffeine you drink, or stop drinking caffeine. General instructions  Keep all follow-up visits as told by your doctor. This is important.  Keep a journal to find out if certain things bring on headaches. For example, write down: ? What you eat and drink. ? How much sleep you get. ? Any change to  your diet or medicines.  Relax by getting a massage or doing other relaxing activities.  Lessen stress.  Sit up straight. Do not tighten (tense) your muscles.  Do not use tobacco products. This includes cigarettes, chewing tobacco, or e-cigarettes. If you need help quitting, ask your doctor.  Exercise regularly as told by your doctor.  Get enough sleep. This often means 7-9 hours of sleep. Contact a doctor if:  Your symptoms are not helped by medicine.  You have a headache that feels different than the other headaches.  You feel sick to your stomach (nauseous) or you throw up (vomit).  You have a fever. Get help right away if:  Your headache becomes really bad.  You keep throwing up.  You have a stiff neck.  You have trouble seeing.  You have trouble speaking.  You have pain in the eye or ear.  Your muscles are weak or you lose muscle control.  You lose your balance or have trouble walking.  You feel like you will pass out (faint) or you pass out.  You have confusion. This information is not intended to replace advice given to you by your health care provider. Make sure you discuss any questions you have with your health care provider. Document Released: 12/22/2007 Document Revised: 08/20/2015 Document Reviewed: 07/07/2014 Elsevier Interactive Patient Education  2018 [employer]  

## 2017-10-12 NOTE — Assessment & Plan Note (Signed)
Benign symptoms.  No red flag signs or symptoms.  No visible rash.  Advised about possible signs and symptoms of shingles where you get pain before the rash.  No prior history of migraines.  Possible atypical migraine presentation but unlikely.  No neurological findings.  Intact neurological exam.  Blood work done today.  Advised to monitor symptoms and schedule brain MRI as needed.  Advised to go to the emergency room if worse return here as needed.

## 2017-10-12 NOTE — Progress Notes (Signed)
Stacey Mccullough 27 y.o.   Chief Complaint  Patient presents with  . facial swelling    bilateral, more on the right side x 2 days ago, numbness in the face  . Headache    right side of face behind the right eye, "intense headache comes and goes"  . Ringing in ears    "happened once last night"  Okay  HISTORY OF PRESENT ILLNESS: This is a 27 y.o. female complaining of headache that started 3 days ago.  Healthy female with no chronic medical problems.  On no medications.  No change in lifestyle.  No history of migraine headaches.  She has been getting intermittent episodes of right-sided sharp intense pain behind the right eye lasting for 15 to 20 seconds.  Yesterday morning noticed facial swelling.  Took ibuprofen and helped.  Tylenol in the afternoon for headache that helped.  Yesterday during dinner developed ringing in both ears that lasted about 30 seconds.  Also has some numbness to the face.  Denies any other significant symptoms.  Sleeps 5 to 6 hours a night.  Has a 72-month-old daughter.  Works at [employer], regular hours.  Denies facial rash or visual problems.  Wears contact lenses.  Denies ear pain.  Denies nausea or vomiting.  Denies photophobia.  HPI   Prior to Admission medications   Medication Sig Start Date End Date Taking? Authorizing Provider  acetaminophen (TYLENOL) 500 MG tablet Take 1,000 mg by mouth as needed for mild pain.    Yes [provider]  diphenhydrAMINE (BENADRYL) 25 MG tablet Take 25 mg by mouth at bedtime as needed for sleep.   Yes [provider]  ibuprofen (ADVIL,MOTRIN) 600 MG tablet Take 1 tablet (600 mg total) by mouth every 6 (six) hours. 07/21/16  Yes Huel Cote, MD  docusate sodium (COLACE) 100 MG capsule Take 100 mg by mouth daily as needed for mild constipation.    [provider]  Prenatal Vit-Fe Fumarate-FA (MULTIVITAMIN-PRENATAL) 27-0.8 MG TABS tablet Take 1 tablet by mouth daily at 12 noon.    [provider]    No Known Allergies  Patient Active Problem List   Diagnosis Date Noted  . Abdominal pain, chronic, epigastric 06/21/2013  . Decreased visual acuity 11/14/2011    Past Medical History:  Diagnosis Date  . Chronic epigastric pain    since Dec/comes and goes past eating  . IBS (irritable bowel syndrome)   . Lyme disease 1994  . Nausea     Past Surgical History:  Procedure Laterality Date  . COLONOSCOPY N/A 06/21/2013   Procedure: COLONOSCOPY;  Surgeon: Iva Boop, MD;  Location: [facility];  Service: Endoscopy;  Laterality: N/A;  . TONSILLECTOMY    . TONSILLECTOMY AND ADENOIDECTOMY  2007  . UPPER GASTROINTESTINAL ENDOSCOPY    . WISDOM TOOTH EXTRACTION      Social History   Socioeconomic History  . Marital status: Single    Spouse name: Not on file  . Number of children: 0  . Years of education: Not on file  . Highest education level: Not on file  Occupational History  . Occupation: CERTIFIED MEDICAL ASSISTANT  Social Needs  . Financial resource strain: Not on file  . Food insecurity:    Worry: Not on file    Inability: Not on file  . Transportation needs:    Medical: Not on file    Non-medical: Not on file  Tobacco Use  . Smoking status: Never Smoker  . Smokeless  tobacco: Never Used  Substance and Sexual Activity  . Alcohol use: Yes    Comment: soacial- 2 per month  . Drug use: No  . Sexual activity: Yes    Partners: Male  Lifestyle  . Physical activity:    Days per week: Not on file    Minutes per session: Not on file  . Stress: Not on file  Relationships  . Social connections:    Talks on phone: Not on file    Gets together: Not on file    Attends religious service: Not on file    Active member of club or organization: Not on file    Attends meetings of clubs or organizations: Not on file    Relationship status: Not on file  . Intimate partner violence:    Fear of current or ex partner: Not on file    Emotionally abused: Not  on file    Physically abused: Not on file    Forced sexual activity: Not on file  Other Topics Concern  . Not on file  Social History Narrative   Caffienated drinks- Yes   Seat belt use often- Yes   Regular Exercise- Yes   Smoke alarm in the home- Yes   Firearms/guns in the home-    History of physical abuse- No                Family History  Problem Relation Age of Onset  . Hypertension Mother   . Stroke Mother   . Asthma Brother        x 2  . Asthma Sister   . Cancer Paternal Uncle        Bone Sarcoma     Review of Systems  Constitutional: Negative.  Negative for chills, fever and malaise/fatigue.  HENT: Positive for tinnitus. Negative for congestion, ear pain, hearing loss, nosebleeds, sinus pain and sore throat.   Eyes: Negative.  Negative for blurred vision, double vision, photophobia and pain.  Respiratory: Negative.  Negative for cough, hemoptysis and shortness of breath.   Cardiovascular: Negative.  Negative for chest pain and palpitations.  Gastrointestinal: Negative.  Negative for abdominal pain, blood in stool, diarrhea, melena, nausea and vomiting.  Genitourinary: Negative.  Negative for dysuria and hematuria.  Musculoskeletal: Negative.  Negative for back pain, myalgias and neck pain.  Skin: Negative.  Negative for rash.  Neurological: Positive for headaches. Negative for dizziness, sensory change, speech change, focal weakness, loss of consciousness and weakness.  Endo/Heme/Allergies: Negative.  Negative for environmental allergies. Does not bruise/bleed easily.  All other systems reviewed and are negative.   Vitals:   10/12/17 1102  BP: 100/70  Pulse: 64  Resp: 16  Temp: 98.8 F (37.1 C)  SpO2: 100%    Physical Exam  Constitutional: She is oriented to person, place, and time. She appears well-developed and well-nourished.  HENT:  Head: Normocephalic and atraumatic.  Right Ear: Tympanic membrane, external ear and ear canal normal.  Left Ear:  Tympanic membrane, external ear and ear canal normal.  Nose: Nose normal.  Mouth/Throat: Oropharynx is clear and moist.  Eyes: Pupils are equal, round, and reactive to light. Conjunctivae and EOM are normal.  Fundoscopic exam:      The right eye shows no hemorrhage and no papilledema.       The left eye shows no hemorrhage and no papilledema.  Neck: Normal range of motion. Neck supple. No JVD present. No thyromegaly present.  Cardiovascular: Normal rate, regular rhythm, normal heart  sounds and intact distal pulses.  Pulmonary/Chest: Effort normal and breath sounds normal.  Abdominal: Soft. Bowel sounds are normal. She exhibits no distension. There is no tenderness.  Musculoskeletal: Normal range of motion. She exhibits no edema or tenderness.  Lymphadenopathy:    She has no cervical adenopathy.  Neurological: She is alert and oriented to person, place, and time. She displays normal reflexes. No cranial nerve deficit or sensory deficit. She exhibits normal muscle tone. Coordination normal.  Skin: Skin is warm and dry. Capillary refill takes less than 2 seconds.  Psychiatric: She has a normal mood and affect. Her behavior is normal.  Vitals reviewed.  Acute nonintractable headache Benign symptoms.  No red flag signs or symptoms.  No visible rash.  Advised about possible signs and symptoms of shingles where you get pain before the rash.  No prior history of migraines.  Possible atypical migraine presentation but unlikely.  No neurological findings.  Intact neurological exam.  Blood work done today.  Advised to monitor symptoms and schedule brain MRI as needed.  Advised to go to the emergency room if worse return here as needed.    ASSESSMENT & PLAN: Leiya was seen today for facial swelling, headache and ringing in ears.  Diagnoses and all orders for this visit:  Acute nonintractable headache, unspecified headache type -     CBC with Differential/Platelet -     Comprehensive metabolic  panel -     Sedimentation Rate  Ringing in the ears, bilateral  Facial swelling  Episodic paroxysmal hemicrania, not intractable -     MR BRAIN WO CONTRAST; Future    Patient Instructions       IF you received an x-ray today, you will receive an invoice from [facility]. Please contact [facility] at [phone] with questions or concerns regarding your invoice.   IF you received labwork today, you will receive an invoice from [facility]. Please contact [facility] at [phone] with questions or concerns regarding your invoice.   Our billing staff will not be able to assist you with questions regarding bills from these companies.  You will be contacted with the lab results as soon as they are available. The fastest way to get your results is to activate your My Chart account. Instructions are located on the last page of this paperwork. If you have not heard from [place] regarding the results in 2 weeks, please contact this office.     General Headache Without Cause A headache is pain or discomfort felt around the head or neck area. There are many causes and types of headaches. In some cases, the cause may not be found. Follow these instructions at home: Managing pain  Take over-the-counter and prescription medicines only as told by your doctor.  Lie down in a dark, quiet room when you have a headache.  If directed, apply ice to the head and neck area: ? Put ice in a plastic bag. ? Place a towel between your skin and the bag. ? Leave the ice on for 20 minutes, 2-3 times per day.  Use a heating pad or hot shower to apply heat to the head and neck area as told by your doctor.  Keep lights dim if bright lights bother you or make your headaches worse. Eating and drinking  Eat meals on a regular schedule.  Lessen how much alcohol you drink.  Lessen how much caffeine you drink, or stop drinking caffeine. General instructions  Keep all follow-up visits as  told by  your doctor. This is important.  Keep a journal to find out if certain things bring on headaches. For example, write down: ? What you eat and drink. ? How much sleep you get. ? Any change to your diet or medicines.  Relax by getting a massage or doing other relaxing activities.  Lessen stress.  Sit up straight. Do not tighten (tense) your muscles.  Do not use tobacco products. This includes cigarettes, chewing tobacco, or e-cigarettes. If you need help quitting, ask your doctor.  Exercise regularly as told by your doctor.  Get enough sleep. This often means 7-9 hours of sleep. Contact a doctor if:  Your symptoms are not helped by medicine.  You have a headache that feels different than the other headaches.  You feel sick to your stomach (nauseous) or you throw up (vomit).  You have a fever. Get help right away if:  Your headache becomes really bad.  You keep throwing up.  You have a stiff neck.  You have trouble seeing.  You have trouble speaking.  You have pain in the eye or ear.  Your muscles are weak or you lose muscle control.  You lose your balance or have trouble walking.  You feel like you will pass out (faint) or you pass out.  You have confusion. This information is not intended to replace advice given to you by your health care provider. Make sure you discuss any questions you have with your health care provider. Document Released: 12/22/2007 Document Revised: 08/20/2015 Document Reviewed: 07/07/2014 Elsevier Interactive Patient Education  2018 [employer].      Edwina Barth, MD [facility]

## 2017-10-13 ENCOUNTER — Encounter: Payer: Self-pay | Admitting: Emergency Medicine

## 2017-10-13 ENCOUNTER — Telehealth: Payer: Self-pay | Admitting: Emergency Medicine

## 2017-10-13 LAB — COMPREHENSIVE METABOLIC PANEL
A/G RATIO: 2.2 (ref 1.2–2.2)
ALT: 10 IU/L (ref 0–32)
AST: 16 IU/L (ref 0–40)
Albumin: 4.6 g/dL (ref 3.5–5.5)
Alkaline Phosphatase: 64 IU/L (ref 39–117)
BILIRUBIN TOTAL: 0.4 mg/dL (ref 0.0–1.2)
BUN / CREAT RATIO: 18 (ref 9–23)
BUN: 13 mg/dL (ref 6–20)
CHLORIDE: 105 mmol/L (ref 96–106)
CO2: 22 mmol/L (ref 20–29)
Calcium: 9.2 mg/dL (ref 8.7–10.2)
Creatinine, Ser: 0.73 mg/dL (ref 0.57–1.00)
GFR calc non Af Amer: 112 mL/min/{1.73_m2} (ref >59)
GFR, EST AFRICAN AMERICAN: 129 mL/min/{1.73_m2} (ref >59)
Globulin, Total: 2.1 g/dL (ref 1.5–4.5)
Glucose: 91 mg/dL (ref 65–99)
POTASSIUM: 4.6 mmol/L (ref 3.5–5.2)
SODIUM: 141 mmol/L (ref 134–144)
TOTAL PROTEIN: 6.7 g/dL (ref 6.0–8.5)

## 2017-10-13 LAB — CBC WITH DIFFERENTIAL/PLATELET
BASOS: 1 %
Basophils Absolute: 0 10*3/uL (ref 0.0–0.2)
EOS (ABSOLUTE): 0.1 10*3/uL (ref 0.0–0.4)
Eos: 3 %
Hematocrit: 39.7 % (ref 34.0–46.6)
Hemoglobin: 13.6 g/dL (ref 11.1–15.9)
IMMATURE GRANS (ABS): 0 10*3/uL (ref 0.0–0.1)
Immature Granulocytes: 0 %
LYMPHS: 39 %
Lymphocytes Absolute: 2 10*3/uL (ref 0.7–3.1)
MCH: 29.8 pg (ref 26.6–33.0)
MCHC: 34.3 g/dL (ref 31.5–35.7)
MCV: 87 fL (ref 79–97)
Monocytes Absolute: 0.4 10*3/uL (ref 0.1–0.9)
Monocytes: 8 %
NEUTROS ABS: 2.6 10*3/uL (ref 1.4–7.0)
Neutrophils: 49 %
PLATELETS: 167 10*3/uL (ref 150–450)
RBC: 4.57 x10E6/uL (ref 3.77–5.28)
RDW: 13.5 % (ref 12.3–15.4)
WBC: 5.2 10*3/uL (ref 3.4–10.8)

## 2017-10-13 LAB — SEDIMENTATION RATE: Sed Rate: 2 mm/hr (ref 0–32)

## 2017-10-13 NOTE — Telephone Encounter (Signed)
Copied from CRM [id number]. Topic: Quick Communication - See Telephone Encounter >> Oct 13, 2017 11:46 AM Raquel SarnaHayes, Teresa G wrote: After visit pt went back to work, took ibuprofen and also started her menstrual. Pt went home and took Midol when she got home from work. She woke up this morning w/ no facial swelling.  Pt wanted to let Dr. Alvy BimlerSagardia know this and thinking it all my be menstrual related.

## 2017-10-16 NOTE — Telephone Encounter (Signed)
Thank you for the update. It's good news.

## 2018-11-21 ENCOUNTER — Other Ambulatory Visit: Payer: Self-pay | Admitting: *Deleted

## 2018-11-21 DIAGNOSIS — J09X2 Influenza due to identified novel influenza A virus with other respiratory manifestations: Secondary | ICD-10-CM

## 2018-11-22 LAB — NOVEL CORONAVIRUS, NAA: SARS-CoV-2, NAA: NOT DETECTED

## 2019-03-23 IMAGING — CR DG ABDOMEN ACUTE W/ 1V CHEST
3 series · 3 of 3 positions shown · non-contrast
Comparison: Chest radiographs 01/28/2005. CT abdomen and pelvis
05/16/2013.

CLINICAL DATA: Right lower abdominal pain and constipation for 2
days.

EXAM:
DG ABDOMEN ACUTE W/ 1V CHEST

[w chest pa]
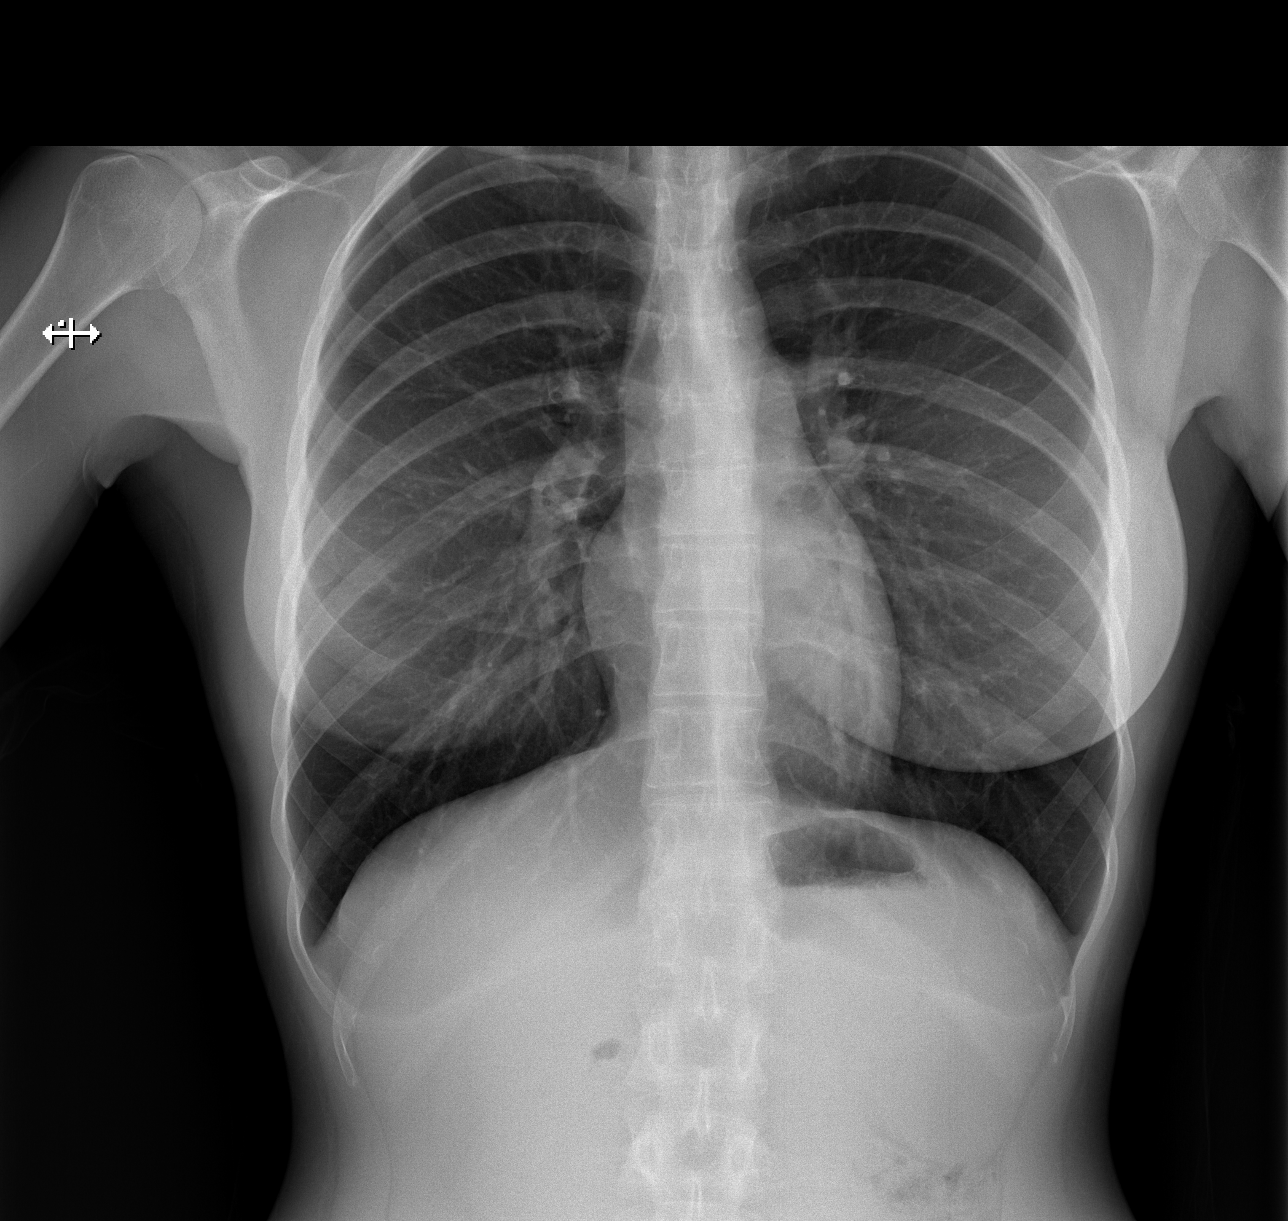

[w abdomen upright]
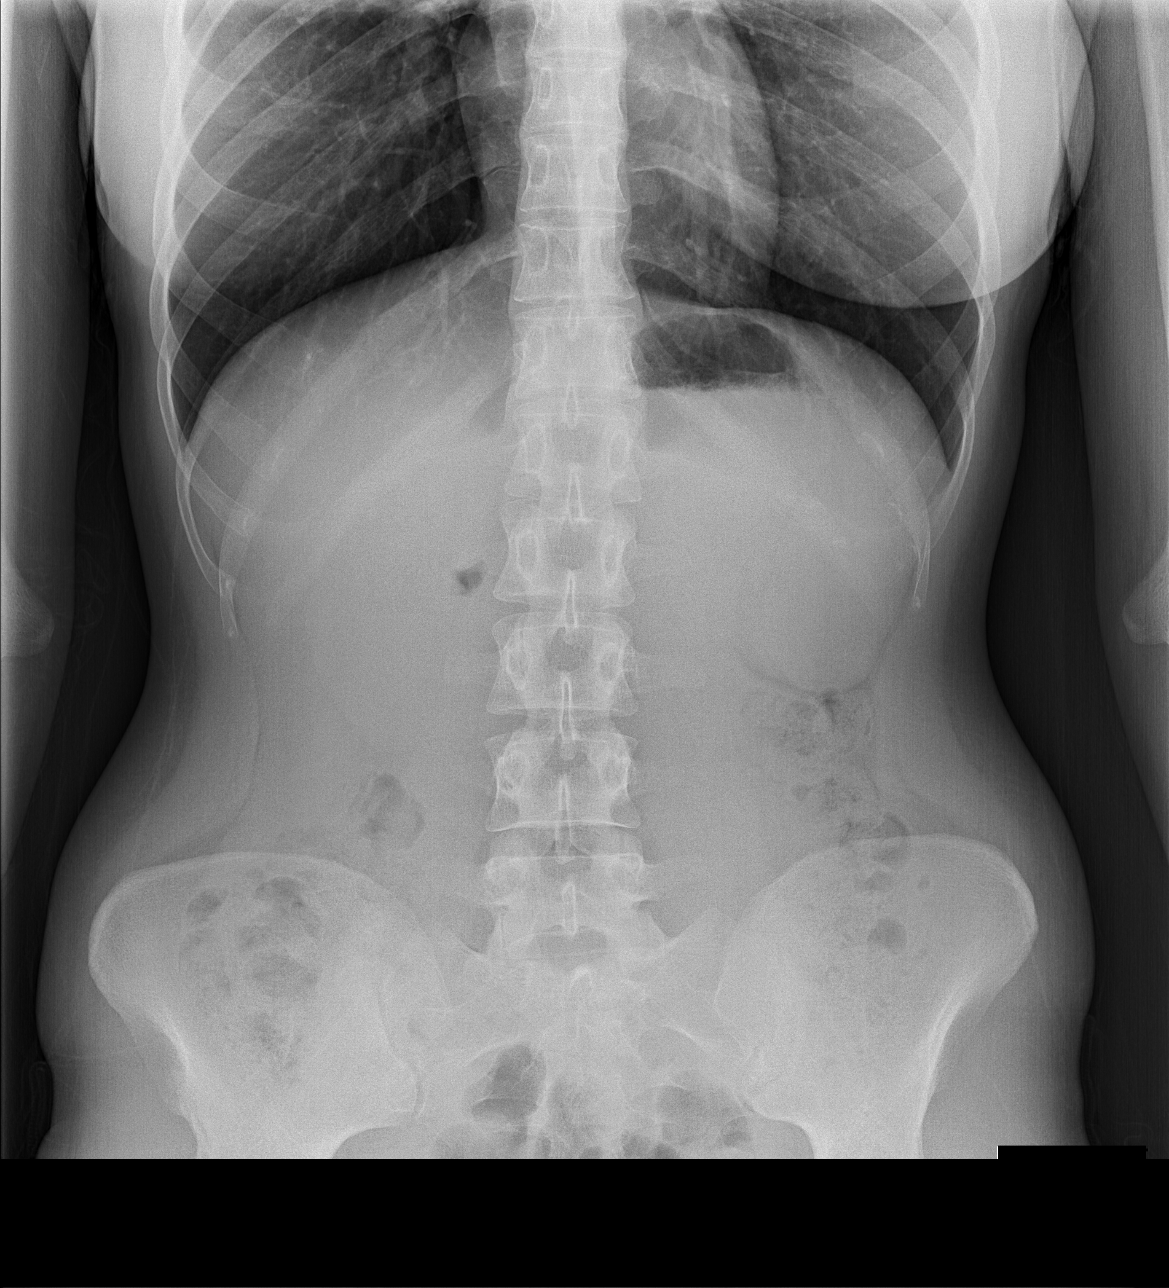

[t abdomen supine]
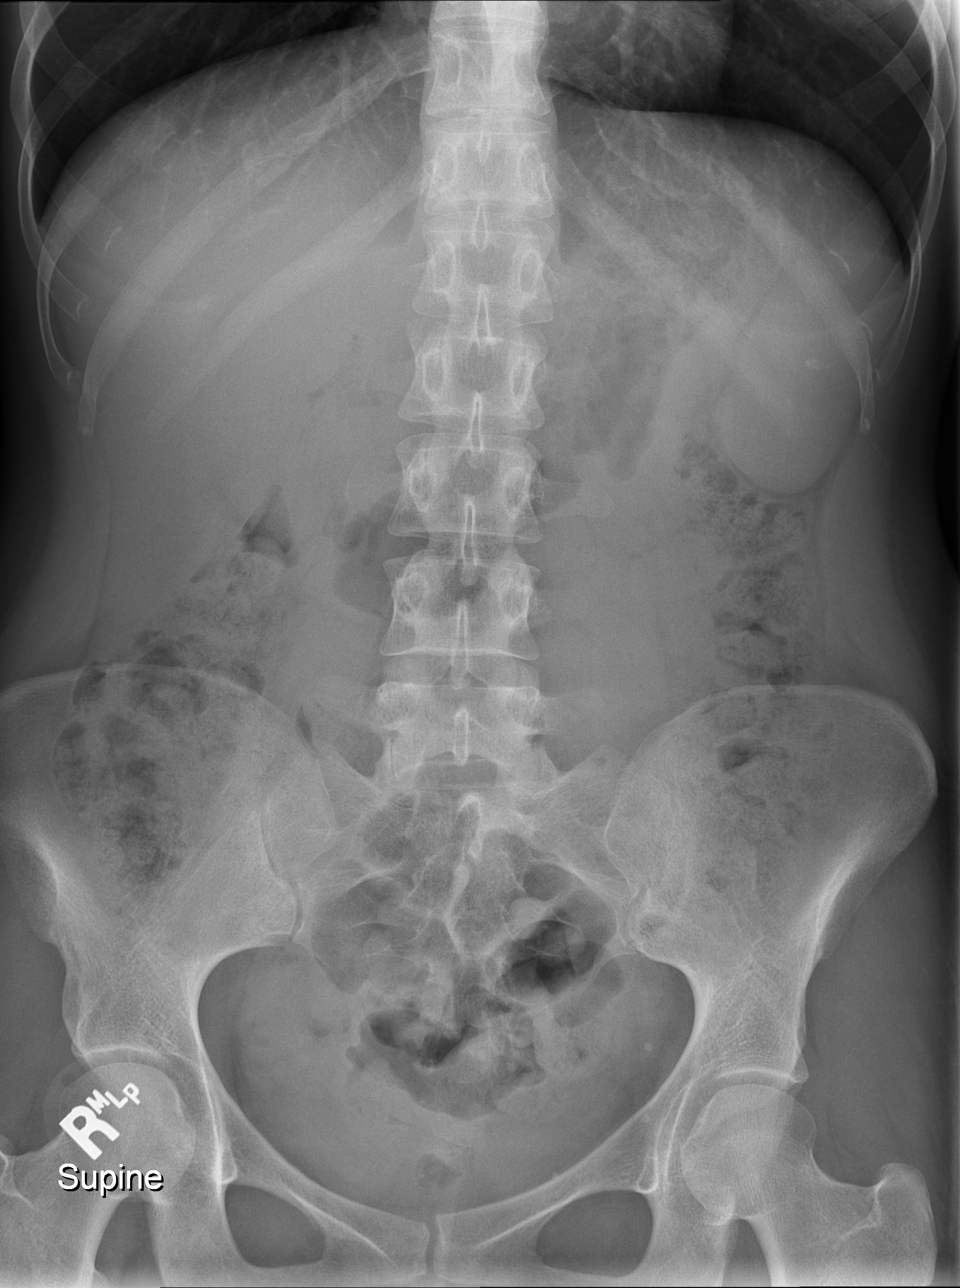

[3 of 3 positions shown; findings below may reference images not displayed]

FINDINGS: The cardiomediastinal silhouette is within normal limits. The lungs
are well inflated and clear. There is no evidence of pleural
effusion or pneumothorax. No acute osseous abnormality is
identified.

There is no evidence of intraperitoneal free air. A moderate amount
of stool is present in the colon. No dilated loops of bowel are seen
to suggest obstruction. No radiopaque urinary tract calculi are
identified.
IMPRESSION: 1. Moderate colonic stool volume without evidence of obstruction.
2. Clear lungs.

## 2019-03-23 IMAGING — CT CT ABD-PELV W/ CM
2 of 4 series · 16 of 46 positions shown, 18 images · IV contrast (ISOVUE)
Comparison: 05/16/2013 CT abdomen and pelvis.

CLINICAL DATA: 26 y/o F; lower abdominal pain, no bowel movement in
2 days, lack of appetite.

EXAM:
CT ABDOMEN AND PELVIS WITH CONTRAST
TECHNIQUE: Multidetector CT imaging of the abdomen and pelvis was performed
using the standard protocol following bolus administration of
intravenous contrast.
CONTRAST:  100mL NLNZRA-1OO IOPAMIDOL (NLNZRA-1OO) INJECTION 61%,
30mL NLNZRA-1OO IOPAMIDOL (NLNZRA-1OO) INJECTION 61%

[Series 2: axial st · axial · 0.72mm/px · z∈[+995,+1355]mm · 13 of 82 slices shown, 15 images]
[im 5/82  soft-tissue]
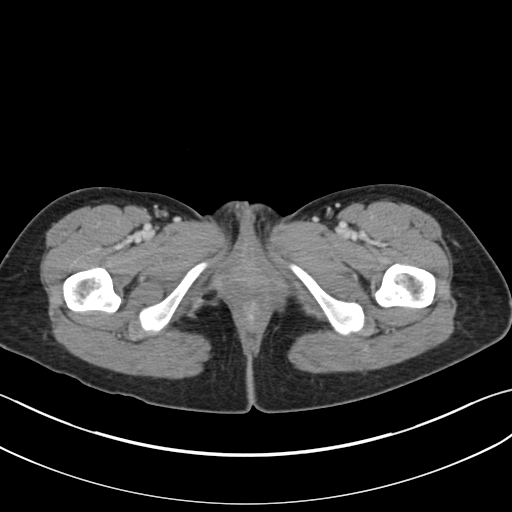
[im 5/82  bone]
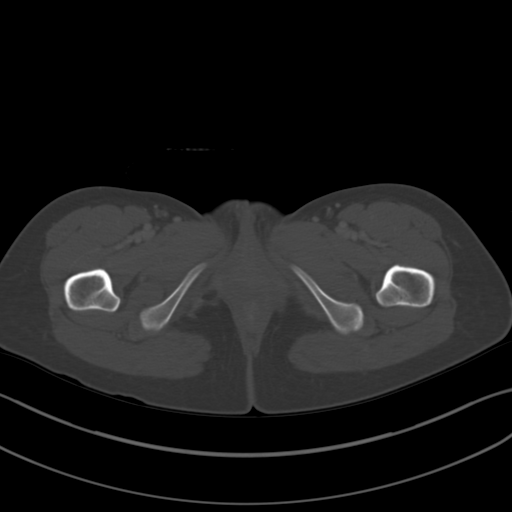
[im 10/82  soft-tissue]
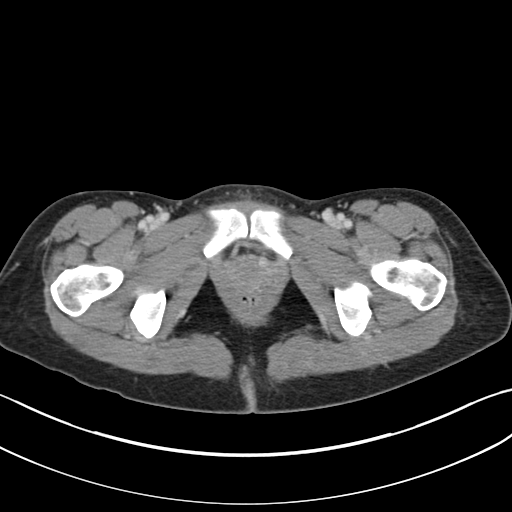
[im 20/82  soft-tissue]
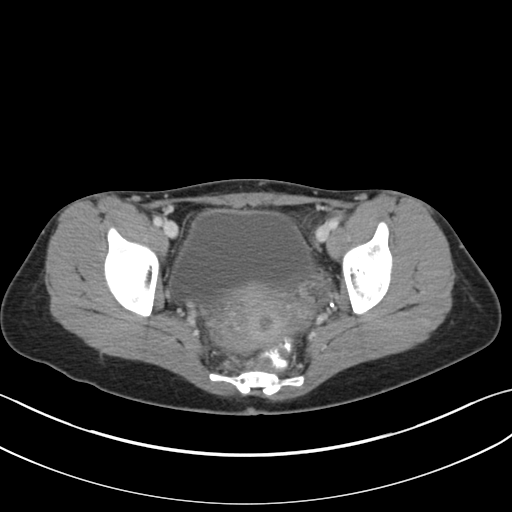
[im 24/82  soft-tissue]
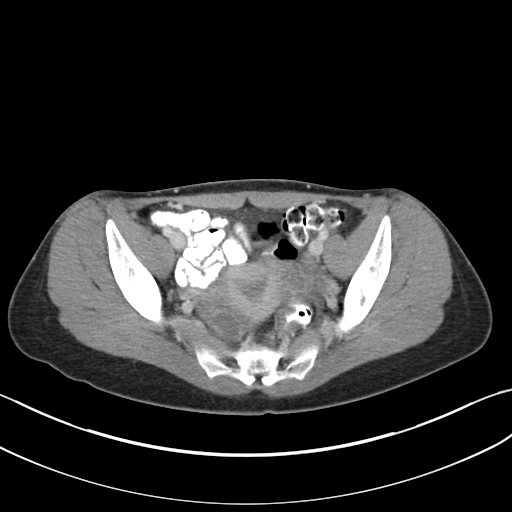
[im 29/82  soft-tissue]
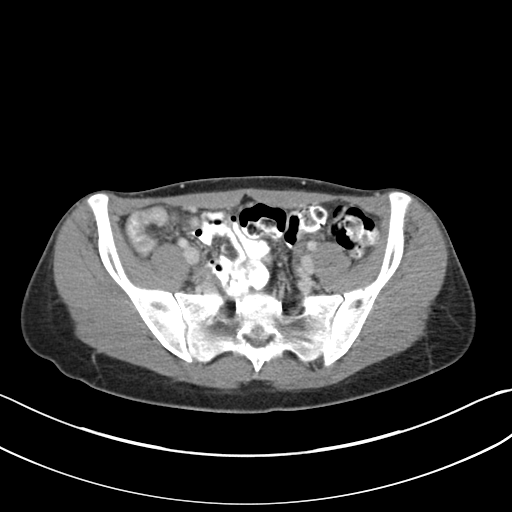
[im 34/82  soft-tissue]
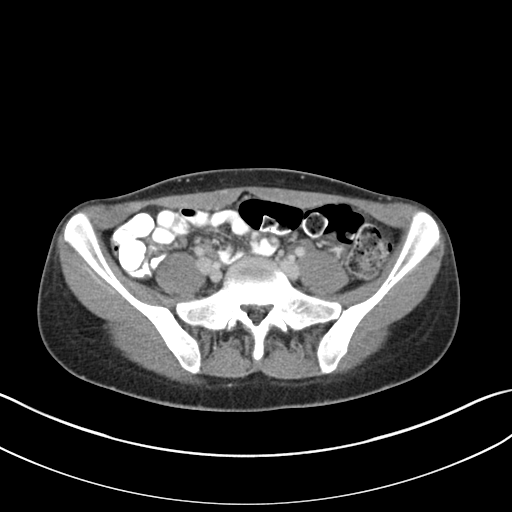
[im 43/82  soft-tissue]
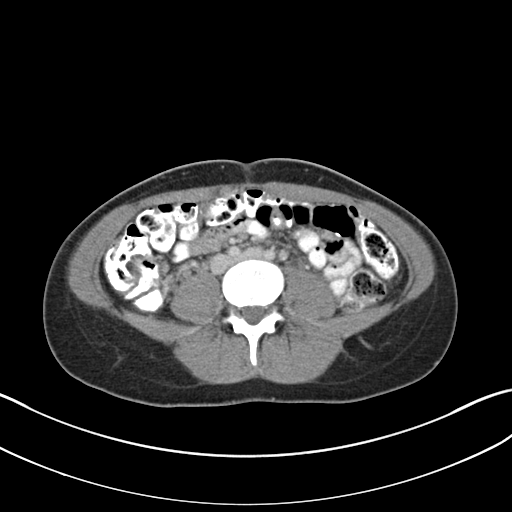
[im 48/82  soft-tissue]
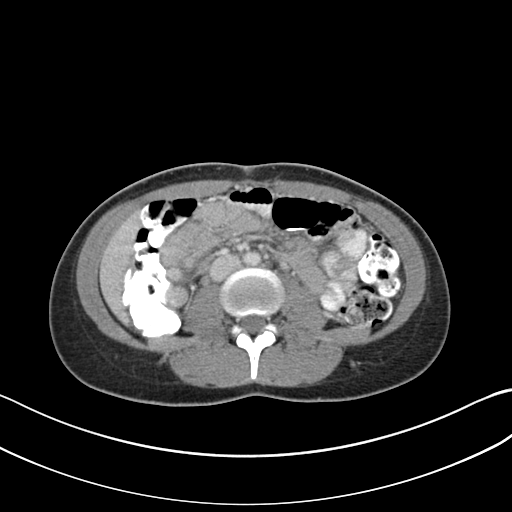
[im 53/82  soft-tissue]
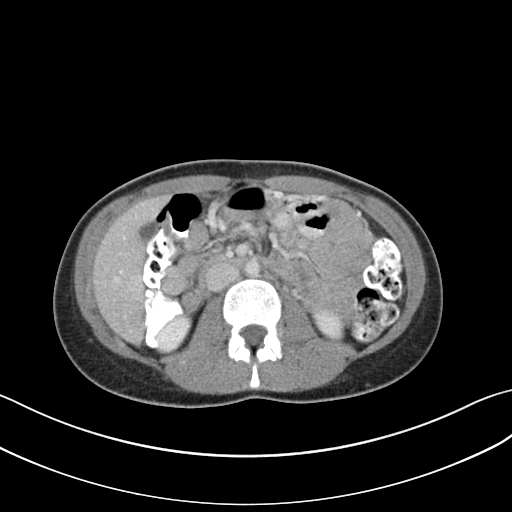
[im 53/82  bone]
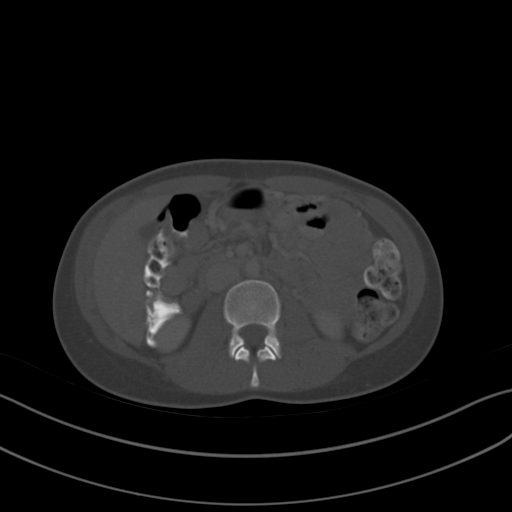
[im 58/82  soft-tissue]
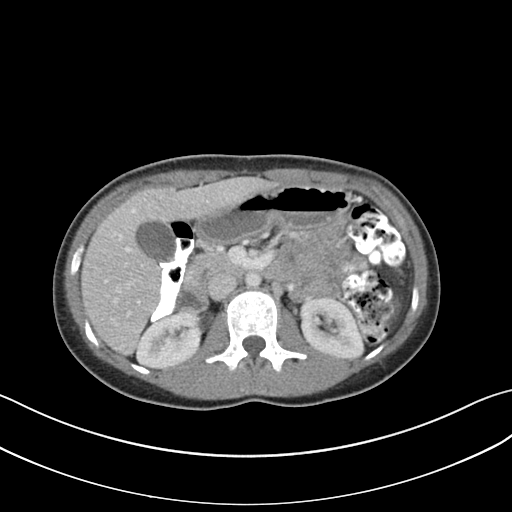
[im 62/82  soft-tissue]
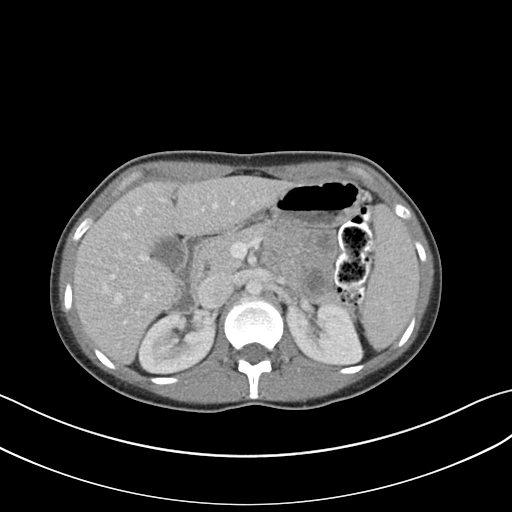
[im 72/82  soft-tissue]
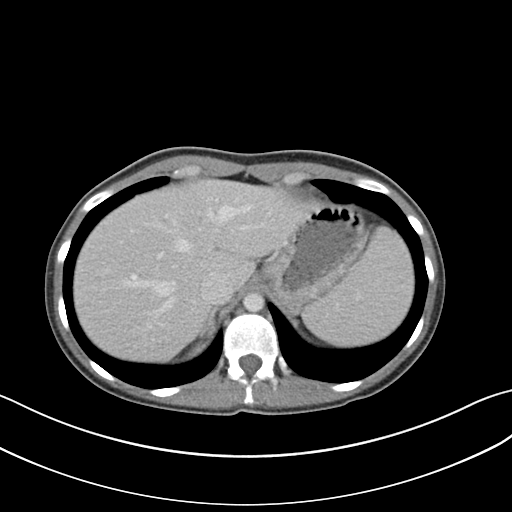
[im 77/82  soft-tissue]
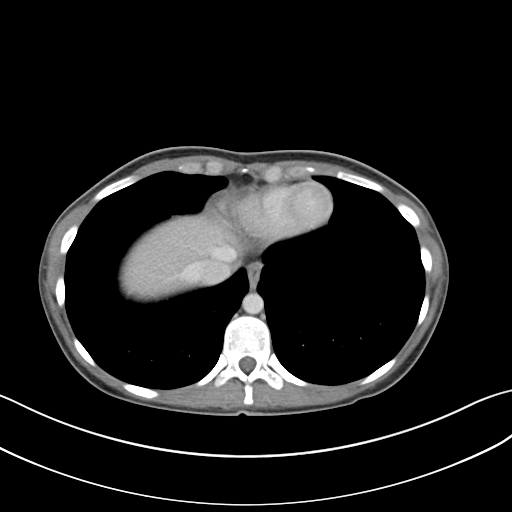

[Series 5: coronal st · coronal · 0.66mm/px · 3 of 89 slices shown]
[im 30/89  soft-tissue]
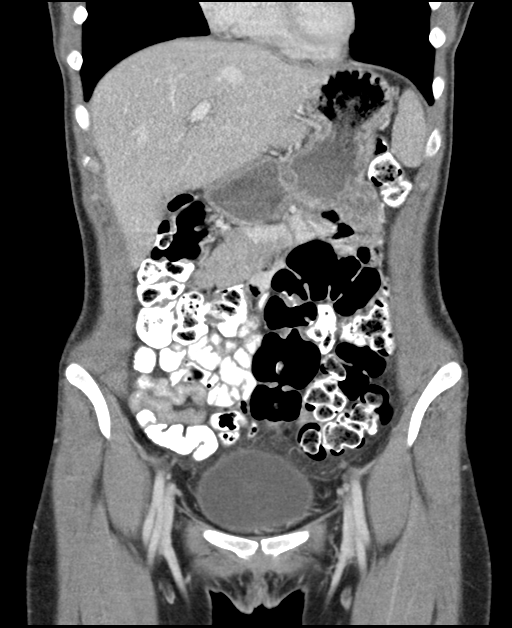
[im 40/89  soft-tissue]
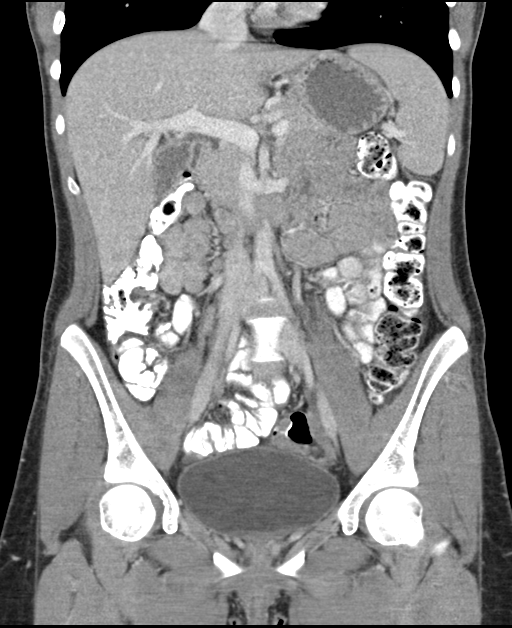
[im 49/89  soft-tissue]
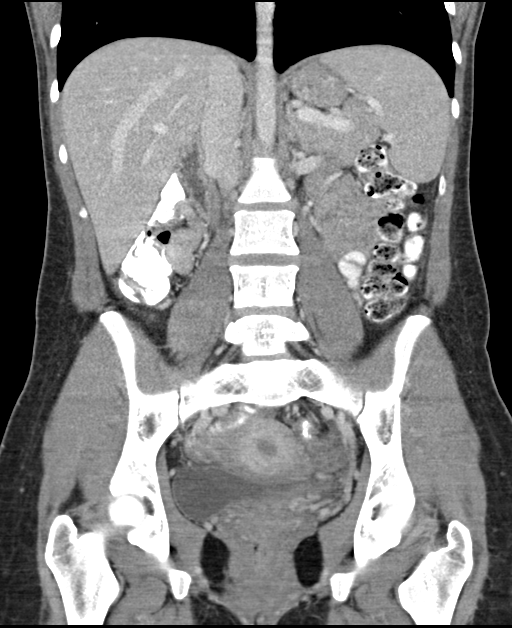

[16 of 46 positions shown; findings below may reference images not displayed]

FINDINGS: Lower chest: No acute abnormality.

Hepatobiliary: No focal liver abnormality is seen. No gallstones,
gallbladder wall thickening, or biliary dilatation.

Pancreas: Unremarkable. No pancreatic ductal dilatation or
surrounding inflammatory changes.

Spleen: Normal in size without focal abnormality.

Adrenals/Urinary Tract: Adrenal glands are unremarkable. Kidneys are
normal, without renal calculi, focal lesion, or hydronephrosis.
Bladder is unremarkable.

Stomach/Bowel: Stomach is within normal limits. Appendix appears
normal. No evidence of bowel wall thickening, distention, or
inflammatory changes.

Vascular/Lymphatic: No significant vascular findings are present. No
enlarged abdominal or pelvic lymph nodes.

Reproductive: Uterus and bilateral adnexa are unremarkable.

Other: No abdominal wall hernia or abnormality. No abdominopelvic
ascites.

Musculoskeletal: No acute or significant osseous findings.
IMPRESSION: No acute process identified.  Negative CT of the abdomen and pelvis.

By: Jordens Ivens M.D.

## 2020-03-04 ENCOUNTER — Other Ambulatory Visit (HOSPITAL_COMMUNITY): Payer: Self-pay

## 2021-04-13 LAB — OB RESULTS CONSOLE HIV ANTIBODY (ROUTINE TESTING): HIV: NONREACTIVE

## 2021-04-13 LAB — OB RESULTS CONSOLE GC/CHLAMYDIA
Chlamydia: NEGATIVE
Neisseria Gonorrhea: NEGATIVE

## 2021-04-13 LAB — OB RESULTS CONSOLE HEPATITIS B SURFACE ANTIGEN: Hepatitis B Surface Ag: NEGATIVE

## 2021-04-13 LAB — OB RESULTS CONSOLE RUBELLA ANTIBODY, IGM: Rubella: IMMUNE

## 2021-08-26 ENCOUNTER — Other Ambulatory Visit: Payer: Self-pay | Admitting: Obstetrics and Gynecology

## 2021-08-26 ENCOUNTER — Other Ambulatory Visit: Payer: Self-pay

## 2021-08-26 DIAGNOSIS — R769 Abnormal immunological finding in serum, unspecified: Secondary | ICD-10-CM

## 2021-08-26 DIAGNOSIS — Z363 Encounter for antenatal screening for malformations: Secondary | ICD-10-CM

## 2021-08-27 ENCOUNTER — Ambulatory Visit: Payer: No Typology Code available for payment source | Admitting: *Deleted

## 2021-08-27 ENCOUNTER — Other Ambulatory Visit: Payer: Self-pay | Admitting: *Deleted

## 2021-08-27 ENCOUNTER — Encounter: Payer: Self-pay | Admitting: *Deleted

## 2021-08-27 ENCOUNTER — Other Ambulatory Visit: Payer: Self-pay | Admitting: Obstetrics and Gynecology

## 2021-08-27 ENCOUNTER — Ambulatory Visit (HOSPITAL_BASED_OUTPATIENT_CLINIC_OR_DEPARTMENT_OTHER): Payer: No Typology Code available for payment source | Admitting: Obstetrics

## 2021-08-27 ENCOUNTER — Ambulatory Visit: Payer: No Typology Code available for payment source | Attending: Obstetrics

## 2021-08-27 VITALS — BP 121/73 | HR 80

## 2021-08-27 DIAGNOSIS — Z3A29 29 weeks gestation of pregnancy: Secondary | ICD-10-CM

## 2021-08-27 DIAGNOSIS — R769 Abnormal immunological finding in serum, unspecified: Secondary | ICD-10-CM | POA: Insufficient documentation

## 2021-08-27 DIAGNOSIS — Z3689 Encounter for other specified antenatal screening: Secondary | ICD-10-CM | POA: Diagnosis present

## 2021-08-27 DIAGNOSIS — Z363 Encounter for antenatal screening for malformations: Secondary | ICD-10-CM | POA: Insufficient documentation

## 2021-08-27 DIAGNOSIS — O361130 Maternal care for Anti-A sensitization, third trimester, not applicable or unspecified: Secondary | ICD-10-CM

## 2021-08-29 NOTE — Progress Notes (Signed)
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  The patient has been followed with monthly anti-c (small) antibody titer levels since the beginning of her current pregnancy.  At around 27 weeks, the anti-c antibody titer levels increased to 1:16.    This is the patient's second pregnancy.  She reports an uncomplicated full-term vaginal delivery with her first pregnancy.    Her first child was diagnosed with a VSD that has not required surgical repair.    She had a fetal echocardiogram performed earlier in her current pregnancy with [facility] that indicated possible a small VSD in this baby. An echocardiogram is recommended for the baby after birth.    The patient denies any history of a prior blood transfusion.  Her husband has not been tested to determine if he carries the c antigen.  She denies any other significant past medical history and denies any problems in her current pregnancy.    She has declined all screening tests for fetal aneuploidy in her current pregnancy.  She was informed that the fetal growth and amniotic fluid level were appropriate for her gestational age.   The views of the fetal anatomy were limited today due to her advanced gestational age.  However, there were no obvious fetal anomalies suspected.  The following were discussed today:  Isoimmunization (anti-c antibody)  The implications and management of isoimmunization to the "c" antigen was discussed with the patient today.    She was advised that she most likely was exposed to the "c" antigen from her prior child.  The patient denies any history of a blood transfusion.    She was advised that should the fetus that she is carrying also have the c antigen on its red blood cells (inherited from the father), that the anti-c antibodies in her blood may cross over the placenta and cause fetal anemia.    She was reassured that the peak systolic velocity of the middle cerebral artery (MCA)  measured today was less than 1.5 multiple of the median (MoM) for her gestational age, indicating that her fetus is not anemic at this time.   There were no signs of fetal hydrops noted on today's exam.   As her anti-c antibody titer levels have now reached a critical titer of 1:16, we will continue to follow her with weekly MCA Doppler measurements to screen for fetal anemia.    Should the peak systolic velocity of the MCA Dopplers reach 1.5 MoM's or higher, either delivery or an intrauterine fetal blood transfusion may be necessary.  Weekly fetal testing is recommended starting at 32 weeks.  Should the MCA Doppler studies continue to be less than 1.5 MoM's for her gestational age, delivery is recommended at between 67 to 77 weeks.    As the antibodies may persist in the baby's circulation after birth, her baby should continue to be observed for anemia and hemolysis/jaundice after birth.  She was advised to notify her pediatrician regarding isoimmunization during pregnancy.  Another MCA Doppler study was scheduled in 1 week.  Her husband was offered and declined to be tested for the "c" antigen on his red blood cells, as the results of the test would not change the management of her pregnancy.  At the end of the consultation, the patient stated that all her questions have been answered to her complete satisfaction.  A total of 60 minutes was spent counseling and coordinating the care for this patient.  Greater than 50% of the  time was spent in direct face-to-face contact.  Recommendations:  Weekly MCA Doppler studies to screen for fetal anemia Weekly fetal testing starting at 32 weeks Delivery at between 37 to 38 weeks should the MCA Doppler studies indicate a low risk of fetal anemia

## 2021-09-02 ENCOUNTER — Other Ambulatory Visit: Payer: Self-pay | Admitting: Obstetrics

## 2021-09-02 ENCOUNTER — Ambulatory Visit: Payer: No Typology Code available for payment source | Attending: Obstetrics and Gynecology

## 2021-09-02 ENCOUNTER — Other Ambulatory Visit: Payer: Self-pay | Admitting: *Deleted

## 2021-09-02 ENCOUNTER — Ambulatory Visit: Payer: No Typology Code available for payment source | Admitting: *Deleted

## 2021-09-02 VITALS — BP 114/65 | HR 81

## 2021-09-02 DIAGNOSIS — Z3A30 30 weeks gestation of pregnancy: Secondary | ICD-10-CM | POA: Diagnosis not present

## 2021-09-02 DIAGNOSIS — O361130 Maternal care for Anti-A sensitization, third trimester, not applicable or unspecified: Secondary | ICD-10-CM

## 2021-09-02 DIAGNOSIS — Z3689 Encounter for other specified antenatal screening: Secondary | ICD-10-CM

## 2021-09-02 DIAGNOSIS — Z8279 Family history of other congenital malformations, deformations and chromosomal abnormalities: Secondary | ICD-10-CM

## 2021-09-08 ENCOUNTER — Ambulatory Visit: Payer: No Typology Code available for payment source | Admitting: *Deleted

## 2021-09-08 ENCOUNTER — Other Ambulatory Visit: Payer: Self-pay | Admitting: Obstetrics

## 2021-09-08 ENCOUNTER — Ambulatory Visit: Payer: No Typology Code available for payment source | Attending: Obstetrics and Gynecology

## 2021-09-08 VITALS — BP 119/68 | HR 76

## 2021-09-08 DIAGNOSIS — O289 Unspecified abnormal findings on antenatal screening of mother: Secondary | ICD-10-CM

## 2021-09-08 DIAGNOSIS — O361130 Maternal care for Anti-A sensitization, third trimester, not applicable or unspecified: Secondary | ICD-10-CM

## 2021-09-08 DIAGNOSIS — Z3A30 30 weeks gestation of pregnancy: Secondary | ICD-10-CM

## 2021-09-10 ENCOUNTER — Other Ambulatory Visit: Payer: PRIVATE HEALTH INSURANCE

## 2021-09-10 ENCOUNTER — Ambulatory Visit: Payer: PRIVATE HEALTH INSURANCE

## 2021-09-15 ENCOUNTER — Ambulatory Visit: Payer: PRIVATE HEALTH INSURANCE

## 2021-09-17 ENCOUNTER — Ambulatory Visit: Payer: No Typology Code available for payment source | Attending: Obstetrics and Gynecology

## 2021-09-17 ENCOUNTER — Ambulatory Visit: Payer: No Typology Code available for payment source | Admitting: *Deleted

## 2021-09-17 VITALS — BP 115/66 | HR 82

## 2021-09-17 DIAGNOSIS — O361130 Maternal care for Anti-A sensitization, third trimester, not applicable or unspecified: Secondary | ICD-10-CM

## 2021-09-17 DIAGNOSIS — Z3689 Encounter for other specified antenatal screening: Secondary | ICD-10-CM | POA: Diagnosis present

## 2021-09-17 DIAGNOSIS — Z8279 Family history of other congenital malformations, deformations and chromosomal abnormalities: Secondary | ICD-10-CM | POA: Diagnosis present

## 2021-09-20 ENCOUNTER — Other Ambulatory Visit: Payer: Self-pay | Admitting: *Deleted

## 2021-09-20 DIAGNOSIS — O361130 Maternal care for Anti-A sensitization, third trimester, not applicable or unspecified: Secondary | ICD-10-CM

## 2021-09-24 ENCOUNTER — Ambulatory Visit: Payer: No Typology Code available for payment source | Attending: Obstetrics and Gynecology

## 2021-09-24 ENCOUNTER — Encounter: Payer: Self-pay | Admitting: *Deleted

## 2021-09-24 ENCOUNTER — Ambulatory Visit: Payer: No Typology Code available for payment source | Admitting: *Deleted

## 2021-09-24 ENCOUNTER — Other Ambulatory Visit: Payer: Self-pay | Admitting: Obstetrics

## 2021-09-24 VITALS — BP 115/68 | HR 76

## 2021-09-24 DIAGNOSIS — Z3689 Encounter for other specified antenatal screening: Secondary | ICD-10-CM

## 2021-09-24 DIAGNOSIS — O361190 Maternal care for Anti-A sensitization, unspecified trimester, not applicable or unspecified: Secondary | ICD-10-CM | POA: Diagnosis present

## 2021-09-24 DIAGNOSIS — O361130 Maternal care for Anti-A sensitization, third trimester, not applicable or unspecified: Secondary | ICD-10-CM | POA: Diagnosis present

## 2021-09-24 DIAGNOSIS — Z3A33 33 weeks gestation of pregnancy: Secondary | ICD-10-CM | POA: Diagnosis not present

## 2021-09-24 DIAGNOSIS — Z8279 Family history of other congenital malformations, deformations and chromosomal abnormalities: Secondary | ICD-10-CM | POA: Diagnosis present

## 2021-09-24 DIAGNOSIS — O361930 Maternal care for other isoimmunization, third trimester, not applicable or unspecified: Secondary | ICD-10-CM | POA: Diagnosis not present

## 2021-09-24 NOTE — Procedures (Signed)
Stacey Mccullough April 29, 1990 [redacted]w[redacted]d  Fetus A Non-Stress Test Interpretation for 09/24/21  Indication: Unsatisfactory BPP  Fetal Heart Rate A Mode: External Baseline Rate (A): 140 bpm Variability: Moderate Accelerations: 15 x 15 Decelerations: Variable Multiple birth?: No  Uterine Activity Mode: Toco Contraction Frequency (min): 1 u/c during NST Contraction Duration (sec): 60 Contraction Quality: Mild (not felt by pt) Resting Tone Palpated: Relaxed  Interpretation (Fetal Testing) Nonstress Test Interpretation: Reactive Overall Impression: Reassuring for gestational age Comments: tracing reviewed byDr. Judeth Cornfield

## 2021-10-01 ENCOUNTER — Ambulatory Visit: Payer: PRIVATE HEALTH INSURANCE | Admitting: *Deleted

## 2021-10-01 ENCOUNTER — Ambulatory Visit (HOSPITAL_BASED_OUTPATIENT_CLINIC_OR_DEPARTMENT_OTHER): Payer: PRIVATE HEALTH INSURANCE

## 2021-10-01 ENCOUNTER — Other Ambulatory Visit: Payer: Self-pay | Admitting: Obstetrics

## 2021-10-01 ENCOUNTER — Encounter: Payer: Self-pay | Admitting: *Deleted

## 2021-10-01 DIAGNOSIS — D6861 Antiphospholipid syndrome: Secondary | ICD-10-CM | POA: Insufficient documentation

## 2021-10-01 DIAGNOSIS — Z8279 Family history of other congenital malformations, deformations and chromosomal abnormalities: Secondary | ICD-10-CM | POA: Insufficient documentation

## 2021-10-01 DIAGNOSIS — Z3A34 34 weeks gestation of pregnancy: Secondary | ICD-10-CM | POA: Diagnosis not present

## 2021-10-01 DIAGNOSIS — O368390 Maternal care for abnormalities of the fetal heart rate or rhythm, unspecified trimester, not applicable or unspecified: Secondary | ICD-10-CM | POA: Diagnosis not present

## 2021-10-01 DIAGNOSIS — O361130 Maternal care for Anti-A sensitization, third trimester, not applicable or unspecified: Secondary | ICD-10-CM | POA: Insufficient documentation

## 2021-10-01 DIAGNOSIS — D696 Thrombocytopenia, unspecified: Secondary | ICD-10-CM | POA: Diagnosis not present

## 2021-10-01 DIAGNOSIS — O361930 Maternal care for other isoimmunization, third trimester, not applicable or unspecified: Secondary | ICD-10-CM

## 2021-10-01 DIAGNOSIS — Z3689 Encounter for other specified antenatal screening: Secondary | ICD-10-CM | POA: Insufficient documentation

## 2021-10-01 DIAGNOSIS — O358XX0 Maternal care for other (suspected) fetal abnormality and damage, not applicable or unspecified: Secondary | ICD-10-CM | POA: Diagnosis not present

## 2021-10-01 NOTE — Procedures (Signed)
Stacey Mccullough [birth year] [redacted]w[redacted]d  Fetus A Non-Stress Test Interpretation for 10/01/21  Indication: Unsatisfactory BPP  Fetal Heart Rate A Mode: External Baseline Rate (A): 145 bpm Variability: Moderate Accelerations: 15 x 15 Decelerations: Variable Multiple birth?: No  Uterine Activity Mode: Toco Contraction Frequency (min): none Resting Tone Palpated: Relaxed  Interpretation (Fetal Testing) Nonstress Test Interpretation: Reactive Overall Impression: Reassuring for gestational age Comments: tracing reviewed by Dr. Parke Poisson

## 2021-10-04 ENCOUNTER — Ambulatory Visit: Payer: PRIVATE HEALTH INSURANCE | Admitting: *Deleted

## 2021-10-04 ENCOUNTER — Inpatient Hospital Stay (HOSPITAL_COMMUNITY)
Admission: AD | Admit: 2021-10-04 | Discharge: 2021-10-06 | DRG: 784 | Disposition: A | Discharge status: Home / Self Care | Payer: PRIVATE HEALTH INSURANCE | Attending: Obstetrics and Gynecology | Admitting: Obstetrics and Gynecology

## 2021-10-04 ENCOUNTER — Encounter (HOSPITAL_COMMUNITY): Payer: Self-pay | Admitting: Obstetrics and Gynecology

## 2021-10-04 ENCOUNTER — Encounter (HOSPITAL_COMMUNITY)
Admission: AD | Disposition: A | Discharge status: Home / Self Care | Payer: Self-pay | Source: Home / Self Care | Attending: Obstetrics and Gynecology

## 2021-10-04 ENCOUNTER — Inpatient Hospital Stay (HOSPITAL_BASED_OUTPATIENT_CLINIC_OR_DEPARTMENT_OTHER): Payer: PRIVATE HEALTH INSURANCE

## 2021-10-04 ENCOUNTER — Other Ambulatory Visit: Payer: Self-pay

## 2021-10-04 ENCOUNTER — Inpatient Hospital Stay (HOSPITAL_BASED_OUTPATIENT_CLINIC_OR_DEPARTMENT_OTHER): Payer: PRIVATE HEALTH INSURANCE | Admitting: Maternal & Fetal Medicine

## 2021-10-04 ENCOUNTER — Ambulatory Visit (HOSPITAL_BASED_OUTPATIENT_CLINIC_OR_DEPARTMENT_OTHER): Payer: PRIVATE HEALTH INSURANCE | Admitting: *Deleted

## 2021-10-04 ENCOUNTER — Inpatient Hospital Stay (HOSPITAL_COMMUNITY): Payer: PRIVATE HEALTH INSURANCE | Admitting: Anesthesiology

## 2021-10-04 DIAGNOSIS — O9912 Other diseases of the blood and blood-forming organs and certain disorders involving the immune mechanism complicating childbirth: Secondary | ICD-10-CM | POA: Diagnosis present

## 2021-10-04 DIAGNOSIS — O288 Other abnormal findings on antenatal screening of mother: Secondary | ICD-10-CM | POA: Diagnosis not present

## 2021-10-04 DIAGNOSIS — O368230 Fetal anemia and thrombocytopenia, third trimester, not applicable or unspecified: Secondary | ICD-10-CM | POA: Diagnosis not present

## 2021-10-04 DIAGNOSIS — O358XX0 Maternal care for other (suspected) fetal abnormality and damage, not applicable or unspecified: Secondary | ICD-10-CM | POA: Diagnosis present

## 2021-10-04 DIAGNOSIS — Z98891 History of uterine scar from previous surgery: Secondary | ICD-10-CM

## 2021-10-04 DIAGNOSIS — Z302 Encounter for sterilization: Secondary | ICD-10-CM

## 2021-10-04 DIAGNOSIS — O361130 Maternal care for Anti-A sensitization, third trimester, not applicable or unspecified: Secondary | ICD-10-CM

## 2021-10-04 DIAGNOSIS — D696 Thrombocytopenia, unspecified: Secondary | ICD-10-CM | POA: Diagnosis present

## 2021-10-04 DIAGNOSIS — Z3A00 Weeks of gestation of pregnancy not specified: Secondary | ICD-10-CM | POA: Insufficient documentation

## 2021-10-04 DIAGNOSIS — O368390 Maternal care for abnormalities of the fetal heart rate or rhythm, unspecified trimester, not applicable or unspecified: Secondary | ICD-10-CM

## 2021-10-04 DIAGNOSIS — Z3A34 34 weeks gestation of pregnancy: Secondary | ICD-10-CM

## 2021-10-04 DIAGNOSIS — O360930 Maternal care for other rhesus isoimmunization, third trimester, not applicable or unspecified: Secondary | ICD-10-CM | POA: Diagnosis present

## 2021-10-04 DIAGNOSIS — O361930 Maternal care for other isoimmunization, third trimester, not applicable or unspecified: Secondary | ICD-10-CM

## 2021-10-04 LAB — CBC
HCT: 36.1 % (ref 36.0–46.0)
Hemoglobin: 12.3 g/dL (ref 12.0–15.0)
MCH: 31.4 pg (ref 26.0–34.0)
MCHC: 34.1 g/dL (ref 30.0–36.0)
MCV: 92.1 fL (ref 80.0–100.0)
Platelets: 106 10*3/uL — ABNORMAL LOW (ref 150–400)
RBC: 3.92 MIL/uL (ref 3.87–5.11)
RDW: 14.2 % (ref 11.5–15.5)
WBC: 7.4 10*3/uL (ref 4.0–10.5)
nRBC: 0 % (ref 0.0–0.2)

## 2021-10-04 LAB — PREPARE RBC (CROSSMATCH)

## 2021-10-04 LAB — KLEIHAUER-BETKE STAIN
Fetal Cells %: 0 %
Quantitation Fetal Hemoglobin: 0 mL

## 2021-10-04 SURGERY — Surgical Case
Anesthesia: Spinal

## 2021-10-04 MED ORDER — DIBUCAINE (PERIANAL) 1 % EX OINT: 1.0000 | TOPICAL_OINTMENT | CUTANEOUS | Status: DC | PRN

## 2021-10-04 MED ORDER — ACETAMINOPHEN 500 MG PO TABS
1000.0000 mg | ORAL_TABLET | Freq: Four times a day (QID) | ORAL | Status: DC
  Administered 2021-10-05 – 2021-10-06 (×6): 1000 mg via ORAL
  Filled 2021-10-04 (×7): qty 2

## 2021-10-04 MED ORDER — LACTATED RINGERS IV BOLUS: 1000.0000 mL | Freq: Once | INTRAVENOUS | Status: DC

## 2021-10-04 MED ORDER — ONDANSETRON HCL 4 MG/2ML IJ SOLN
INTRAMUSCULAR | Status: DC | PRN
  Administered 2021-10-04: 4 mg via INTRAVENOUS

## 2021-10-04 MED ORDER — DIPHENHYDRAMINE HCL 50 MG/ML IJ SOLN: 12.5000 mg | INTRAMUSCULAR | Status: DC | PRN

## 2021-10-04 MED ORDER — FENTANYL CITRATE (PF) 100 MCG/2ML IJ SOLN
INTRAMUSCULAR | Status: AC
  Filled 2021-10-04: qty 2

## 2021-10-04 MED ORDER — PHENYLEPHRINE HCL (PRESSORS) 10 MG/ML IV SOLN
INTRAVENOUS | Status: DC | PRN
  Administered 2021-10-04 (×3): 80 ug via INTRAVENOUS

## 2021-10-04 MED ORDER — ACETAMINOPHEN 10 MG/ML IV SOLN
INTRAVENOUS | Status: DC | PRN
  Administered 2021-10-04: 1000 mg via INTRAVENOUS

## 2021-10-04 MED ORDER — IBUPROFEN 600 MG PO TABS
600.0000 mg | ORAL_TABLET | Freq: Four times a day (QID) | ORAL | Status: DC
  Administered 2021-10-04 – 2021-10-06 (×6): 600 mg via ORAL
  Filled 2021-10-04 (×7): qty 1

## 2021-10-04 MED ORDER — DIPHENHYDRAMINE HCL 25 MG PO CAPS: 25.0000 mg | ORAL_CAPSULE | Freq: Four times a day (QID) | ORAL | Status: DC | PRN

## 2021-10-04 MED ORDER — DEXAMETHASONE SODIUM PHOSPHATE 4 MG/ML IJ SOLN
INTRAMUSCULAR | Status: DC | PRN
  Administered 2021-10-04: 8 mg via INTRAVENOUS

## 2021-10-04 MED ORDER — MEPERIDINE HCL 25 MG/ML IJ SOLN
INTRAMUSCULAR | Status: AC
  Filled 2021-10-04: qty 1

## 2021-10-04 MED ORDER — OXYTOCIN-SODIUM CHLORIDE 30-0.9 UT/500ML-% IV SOLN
INTRAVENOUS | Status: AC
  Filled 2021-10-04: qty 500

## 2021-10-04 MED ORDER — MENTHOL 3 MG MT LOZG: 1.0000 | LOZENGE | OROMUCOSAL | Status: DC | PRN

## 2021-10-04 MED ORDER — LACTATED RINGERS IV SOLN: INTRAVENOUS | Status: DC

## 2021-10-04 MED ORDER — ACETAMINOPHEN 500 MG PO TABS: 1000.0000 mg | ORAL_TABLET | Freq: Four times a day (QID) | ORAL | Status: DC

## 2021-10-04 MED ORDER — ZOLPIDEM TARTRATE 5 MG PO TABS: 5.0000 mg | ORAL_TABLET | Freq: Every evening | ORAL | Status: DC | PRN

## 2021-10-04 MED ORDER — MEPERIDINE HCL 25 MG/ML IJ SOLN
12.5000 mg | Freq: Once | INTRAMUSCULAR | Status: AC
  Administered 2021-10-04: 12.5 mg via INTRAVENOUS

## 2021-10-04 MED ORDER — MORPHINE SULFATE (PF) 0.5 MG/ML IJ SOLN
INTRAMUSCULAR | Status: DC | PRN
  Administered 2021-10-04: 150 ug via INTRATHECAL

## 2021-10-04 MED ORDER — OXYCODONE HCL 5 MG PO TABS
5.0000 mg | ORAL_TABLET | ORAL | Status: DC | PRN
  Administered 2021-10-05 – 2021-10-06 (×2): 5 mg via ORAL
  Filled 2021-10-04 (×2): qty 1

## 2021-10-04 MED ORDER — SODIUM CHLORIDE 0.9 % IR SOLN
Status: DC | PRN
  Administered 2021-10-04: 1

## 2021-10-04 MED ORDER — NALOXONE HCL 0.4 MG/ML IJ SOLN: 0.4000 mg | INTRAMUSCULAR | Status: DC | PRN

## 2021-10-04 MED ORDER — SENNOSIDES-DOCUSATE SODIUM 8.6-50 MG PO TABS
2.0000 | ORAL_TABLET | Freq: Every day | ORAL | Status: DC
  Administered 2021-10-05 – 2021-10-06 (×2): 2 via ORAL
  Filled 2021-10-04 (×2): qty 2

## 2021-10-04 MED ORDER — PRENATAL MULTIVITAMIN CH
1.0000 | ORAL_TABLET | Freq: Every day | ORAL | Status: DC
  Administered 2021-10-05 – 2021-10-06 (×2): 1 via ORAL
  Filled 2021-10-04 (×2): qty 1

## 2021-10-04 MED ORDER — SIMETHICONE 80 MG PO CHEW: 80.0000 mg | CHEWABLE_TABLET | ORAL | Status: DC | PRN

## 2021-10-04 MED ORDER — FENTANYL CITRATE (PF) 100 MCG/2ML IJ SOLN
INTRAMUSCULAR | Status: DC | PRN
Start: 2021-10-04 — End: 2021-10-04
  Administered 2021-10-04: 15 ug via INTRATHECAL

## 2021-10-04 MED ORDER — KETOROLAC TROMETHAMINE 30 MG/ML IJ SOLN: 30.0000 mg | Freq: Four times a day (QID) | INTRAMUSCULAR | Status: AC | PRN

## 2021-10-04 MED ORDER — BUPIVACAINE IN DEXTROSE 0.75-8.25 % IT SOLN
INTRATHECAL | Status: DC | PRN
  Administered 2021-10-04: 1.4 mL via INTRATHECAL

## 2021-10-04 MED ORDER — WITCH HAZEL-GLYCERIN EX PADS: 1.0000 | MEDICATED_PAD | CUTANEOUS | Status: DC | PRN

## 2021-10-04 MED ORDER — OXYTOCIN-SODIUM CHLORIDE 30-0.9 UT/500ML-% IV SOLN: 2.5000 [IU]/h | INTRAVENOUS | Status: AC

## 2021-10-04 MED ORDER — MORPHINE SULFATE (PF) 0.5 MG/ML IJ SOLN
INTRAMUSCULAR | Status: AC
  Filled 2021-10-04: qty 10

## 2021-10-04 MED ORDER — COCONUT OIL OIL
1.0000 | TOPICAL_OIL | Status: DC | PRN
Start: 2021-10-04 — End: 2021-10-06

## 2021-10-04 MED ORDER — METOCLOPRAMIDE HCL 5 MG/ML IJ SOLN
INTRAMUSCULAR | Status: DC | PRN
  Administered 2021-10-04: 5 mg via INTRAVENOUS

## 2021-10-04 MED ORDER — ONDANSETRON HCL 4 MG/2ML IJ SOLN
INTRAMUSCULAR | Status: AC
  Filled 2021-10-04: qty 2

## 2021-10-04 MED ORDER — CEFAZOLIN SODIUM-DEXTROSE 2-4 GM/100ML-% IV SOLN
2.0000 g | INTRAVENOUS | Status: AC
  Administered 2021-10-04: 2 g via INTRAVENOUS
  Filled 2021-10-04: qty 100

## 2021-10-04 MED ORDER — DEXAMETHASONE SODIUM PHOSPHATE 4 MG/ML IJ SOLN
INTRAMUSCULAR | Status: AC
  Filled 2021-10-04: qty 2

## 2021-10-04 MED ORDER — TETANUS-DIPHTH-ACELL PERTUSSIS 5-2.5-18.5 LF-MCG/0.5 IM SUSY: 0.5000 mL | PREFILLED_SYRINGE | Freq: Once | INTRAMUSCULAR | Status: DC

## 2021-10-04 MED ORDER — OXYTOCIN-SODIUM CHLORIDE 30-0.9 UT/500ML-% IV SOLN
INTRAVENOUS | Status: DC | PRN
  Administered 2021-10-04: 400 mL via INTRAVENOUS

## 2021-10-04 MED ORDER — NALOXONE HCL 4 MG/10ML IJ SOLN: 1.0000 ug/kg/h | INTRAVENOUS | Status: DC | PRN

## 2021-10-04 MED ORDER — DEXMEDETOMIDINE (PRECEDEX) IN NS 20 MCG/5ML (4 MCG/ML) IV SYRINGE
PREFILLED_SYRINGE | INTRAVENOUS | Status: DC | PRN
  Administered 2021-10-04: 8 ug via INTRAVENOUS

## 2021-10-04 MED ORDER — PHENYLEPHRINE HCL-NACL 20-0.9 MG/250ML-% IV SOLN
INTRAVENOUS | Status: DC | PRN
  Administered 2021-10-04: 60 ug/min via INTRAVENOUS

## 2021-10-04 MED ORDER — MEPERIDINE HCL 25 MG/ML IJ SOLN: 6.2500 mg | INTRAMUSCULAR | Status: DC | PRN

## 2021-10-04 MED ORDER — SODIUM CHLORIDE 0.9% FLUSH: 3.0000 mL | INTRAVENOUS | Status: DC | PRN

## 2021-10-04 MED ORDER — SOD CITRATE-CITRIC ACID 500-334 MG/5ML PO SOLN
30.0000 mL | Freq: Once | ORAL | Status: AC
  Administered 2021-10-04: 30 mL via ORAL
  Filled 2021-10-04: qty 30

## 2021-10-04 MED ORDER — DIPHENHYDRAMINE HCL 25 MG PO CAPS: 25.0000 mg | ORAL_CAPSULE | ORAL | Status: DC | PRN

## 2021-10-04 MED ORDER — SIMETHICONE 80 MG PO CHEW
80.0000 mg | CHEWABLE_TABLET | Freq: Three times a day (TID) | ORAL | Status: DC
  Administered 2021-10-05 – 2021-10-06 (×5): 80 mg via ORAL
  Filled 2021-10-04 (×5): qty 1

## 2021-10-04 MED ORDER — ONDANSETRON HCL 4 MG/2ML IJ SOLN: 4.0000 mg | Freq: Three times a day (TID) | INTRAMUSCULAR | Status: DC | PRN

## 2021-10-04 MED ORDER — LACTATED RINGERS IV BOLUS
1000.0000 mL | Freq: Once | INTRAVENOUS | Status: AC
  Administered 2021-10-04: 1000 mL via INTRAVENOUS

## 2021-10-04 MED ORDER — SCOPOLAMINE 1 MG/3DAYS TD PT72: 1.0000 | MEDICATED_PATCH | Freq: Once | TRANSDERMAL | Status: DC

## 2021-10-04 MED ORDER — OXYCODONE HCL 5 MG PO TABS: 5.0000 mg | ORAL_TABLET | Freq: Four times a day (QID) | ORAL | Status: DC | PRN

## 2021-10-04 MED ORDER — PHENYLEPHRINE 80 MCG/ML (10ML) SYRINGE FOR IV PUSH (FOR BLOOD PRESSURE SUPPORT)
PREFILLED_SYRINGE | INTRAVENOUS | Status: AC
  Filled 2021-10-04: qty 10

## 2021-10-04 MED ORDER — BETAMETHASONE SOD PHOS & ACET 6 (3-3) MG/ML IJ SUSP
12.0000 mg | Freq: Once | INTRAMUSCULAR | Status: AC
Start: 2021-10-04 — End: 2021-10-04
  Administered 2021-10-04: 12 mg via INTRAMUSCULAR
  Filled 2021-10-04: qty 5

## 2021-10-04 MED ORDER — SODIUM CHLORIDE 0.9% IV SOLUTION: Freq: Once | INTRAVENOUS | Status: DC

## 2021-10-04 SURGICAL SUPPLY — 41 items
ADH SKN CLS APL DERMABOND .7 (GAUZE/BANDAGES/DRESSINGS) ×1
ADH SKN CLS LQ APL DERMABOND (GAUZE/BANDAGES/DRESSINGS) ×1
CHLORAPREP W/TINT 26ML (MISCELLANEOUS) ×4 IMPLANT
CLAMP CORD UMBIL (MISCELLANEOUS) ×2 IMPLANT
CLOTH BEACON ORANGE TIMEOUT ST (SAFETY) ×2 IMPLANT
DERMABOND ADHESIVE PROPEN (GAUZE/BANDAGES/DRESSINGS) ×1
DERMABOND ADVANCED (GAUZE/BANDAGES/DRESSINGS) ×1
DERMABOND ADVANCED .7 DNX12 (GAUZE/BANDAGES/DRESSINGS) ×1 IMPLANT
DERMABOND ADVANCED .7 DNX6 (GAUZE/BANDAGES/DRESSINGS) IMPLANT
DRSG OPSITE POSTOP 4X10 (GAUZE/BANDAGES/DRESSINGS) ×2 IMPLANT
ELECT REM PT RETURN 9FT ADLT (ELECTROSURGICAL) ×2
ELECTRODE REM PT RTRN 9FT ADLT (ELECTROSURGICAL) ×1 IMPLANT
EXTRACTOR VACUUM BELL STYLE (SUCTIONS) IMPLANT
GAUZE SPONGE 4X4 12PLY STRL LF (GAUZE/BANDAGES/DRESSINGS) ×2 IMPLANT
GLOVE BIO SURGEON STRL SZ 6.5 (GLOVE) ×2 IMPLANT
GLOVE BIOGEL PI IND STRL 6.5 (GLOVE) ×1 IMPLANT
GLOVE BIOGEL PI IND STRL 7.0 (GLOVE) ×2 IMPLANT
GLOVE BIOGEL PI INDICATOR 6.5 (GLOVE) ×1
GLOVE BIOGEL PI INDICATOR 7.0 (GLOVE) ×2
GOWN STRL REUS W/TWL LRG LVL3 (GOWN DISPOSABLE) ×4 IMPLANT
KIT ABG SYR 3ML LUER SLIP (SYRINGE) IMPLANT
NDL HYPO 25X5/8 SAFETYGLIDE (NEEDLE) IMPLANT
NEEDLE HYPO 25X5/8 SAFETYGLIDE (NEEDLE) IMPLANT
NS IRRIG 1000ML POUR BTL (IV SOLUTION) ×2 IMPLANT
PACK C SECTION WH (CUSTOM PROCEDURE TRAY) ×2 IMPLANT
PAD ABD 7.5X8 STRL (GAUZE/BANDAGES/DRESSINGS) ×1 IMPLANT
PAD ABD 8X10 STRL (GAUZE/BANDAGES/DRESSINGS) IMPLANT
PAD OB MATERNITY 4.3X12.25 (PERSONAL CARE ITEMS) ×2 IMPLANT
RTRCTR C-SECT PINK 25CM LRG (MISCELLANEOUS) IMPLANT
SUT PDS AB 0 CTX 36 PDP370T (SUTURE) IMPLANT
SUT PLAIN 2 0 (SUTURE)
SUT PLAIN ABS 2-0 CT1 27XMFL (SUTURE) IMPLANT
SUT VIC AB 0 CT1 36 (SUTURE) ×4 IMPLANT
SUT VIC AB 2-0 CT1 27 (SUTURE) ×2
SUT VIC AB 2-0 CT1 TAPERPNT 27 (SUTURE) ×1 IMPLANT
SUT VIC AB 3-0 SH 27 (SUTURE)
SUT VIC AB 3-0 SH 27X BRD (SUTURE) IMPLANT
SUT VIC AB 4-0 KS 27 (SUTURE) ×2 IMPLANT
TOWEL OR 17X24 6PK STRL BLUE (TOWEL DISPOSABLE) ×2 IMPLANT
TRAY FOLEY W/BAG SLVR 14FR LF (SET/KITS/TRAYS/PACK) ×2 IMPLANT
WATER STERILE IRR 1000ML POUR (IV SOLUTION) ×2 IMPLANT

## 2021-10-04 NOTE — Procedures (Signed)
Stacey Mccullough 22-Jul-1990 [redacted]w[redacted]d  Fetus A Non-Stress Test Interpretation for 10/04/21  Indication:  Isoimmunization anti c  Fetal Heart Rate A Mode: External Baseline Rate (A): 130 bpm Variability: Moderate Accelerations: 15 x 15 Decelerations: None Multiple birth?: No  Uterine Activity Mode: Palpation, Toco Contraction Frequency (min): UI Resting Tone Palpated: Relaxed  Interpretation (Fetal Testing) Nonstress Test Interpretation: Reactive Overall Impression: Reassuring for gestational age Comments: Intermittent sinusoidal pattern noted-Dr. Grace Bushy reviewed tracing. Patient sent to [facility] prolonged monitoring, labs, and [place].

## 2021-10-04 NOTE — H&P (Signed)
Stacey Mccullough is a 31 y.o. female presenting for evaluation per MFM. Patient with known anti-C antibody and fetal VSD> Was in fofice for NST with question of sinusoidal tracing. Intermittent sinusoidal tracing still present in MAU, MCA dopplers have now gone from to  <1.5 MoM to 1.8. BPP is now 6/10.  Patient with undesired future fertility.  OB History     Gravida  2   Para  1   Term  1   Preterm      AB      Living  1      SAB      IAB      Ectopic      Multiple  0   Live Births  1          Past Medical History:  Diagnosis Date   Chronic epigastric pain    since Dec/comes and goes past eating   IBS (irritable bowel syndrome)    Lyme disease 1994   Nausea    Past Surgical History:  Procedure Laterality Date   BREAST SURGERY     COLONOSCOPY N/A 06/21/2013   Procedure: COLONOSCOPY;  Surgeon: Iva Boop, MD;  Location: [facility];  Service: Endoscopy;  Laterality: N/A;   TONSILLECTOMY     TONSILLECTOMY AND ADENOIDECTOMY  03/28/2005   UPPER GASTROINTESTINAL ENDOSCOPY     WISDOM TOOTH EXTRACTION     Family History: family history includes Asthma in her brother and sister; Cancer in her paternal uncle; Hypertension in her mother; Stroke in her mother. Social History:  reports that she has never smoked. She has never used smokeless tobacco. She reports current alcohol use. She reports that she does not use drugs.     Maternal Diabetes: No 1hr 111 Genetic Screening: Declined Maternal Ultrasounds/Referrals: Fetal Heart Anomalies Fetal Ultrasounds or other Referrals:  Fetal echo small basilar VSD, may need postnatal echo Maternal Substance Abuse:  No Significant Maternal Medications:  None Significant Maternal Lab Results:  Other: anti-C ab Other Comments:  None  Review of Systems  Constitutional:  Negative for chills and fever.  Respiratory:  Negative for shortness of breath.   Cardiovascular:  Negative for chest pain, palpitations and leg swelling.   Gastrointestinal:  Negative for abdominal pain and vomiting.  Neurological:  Negative for dizziness, weakness and headaches.  Psychiatric/Behavioral:  Negative for suicidal ideas.    History   Blood pressure 130/77, pulse 78, temperature 98.5 F (36.9 C), resp. rate 16, weight 63.9 kg, last menstrual period 02/04/2021, SpO2 100 %, currently breastfeeding. Exam Physical Exam Constitutional:      General: She is not in acute distress.    Appearance: She is well-developed.  HENT:     Head: Normocephalic and atraumatic.  Eyes:     Pupils: Pupils are equal, round, and reactive to light.  Cardiovascular:     Rate and Rhythm: Normal rate and regular rhythm.     Heart sounds: No murmur heard.    No gallop.  Abdominal:     Tenderness: There is no abdominal tenderness. There is no guarding or rebound.  Genitourinary:    Vagina: Normal.  Musculoskeletal:        General: Normal range of motion.     Cervical back: Normal range of motion and neck supple.  Skin:    General: Skin is warm and dry.  Neurological:     Mental Status: She is alert and oriented to person, place, and time.    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 Antenatal monitoring today concerning for sinusoidal pattern, change in Dopplers over 24hrs from <1.5 MoM to 1.8 with BPP from 8/10 going to 6/10. Intermittent sinusoidal tracing still present during prolonged monitoring. Female. At this time, per MFM, move forward with PLTCS for nonreassuring FHR as well as BTL for undesired future fertility  R/B/A of cesarean section discussed with patient. Alternative would be vaginal delivery  which would mean shorter postpartum stay and decreased risk of bleeding. Risks of section include infection of the uterus, pelvic organs, or skin, inadvertent injury to internal organs, such as bowel or bladder. If there is major injury, extensive surgery may be required. If injury is minor, it may be treated with relative ease. Discussed possibility of excessive blood loss and transfusion. If bleeding cannot be controlled using medical or minor surgical methods, a cesarean hysterectomy may be performed which would mean no future fertility. Patient accepts the possibility of blood transfusion, if necessary. Patient understands and agrees to move forward with section.  Dicussed with patient R/B/A of bilateral tubal ligation. Alternatives includes internal ligation, LARCs. Discussed increased risk of ectopic pregnancy after procedure (1 in 3 pregnancies post-ligation can be ectopic) and that a pregnancy test should be taken if suspicion arises postpartum. Discussed risk of failure 1/100, backup method should be used for 2-4wks. Discussed R/B or surgical procedure including bleeding, infection, injury to surrounding organs. Patient agrees to blood products if needed. Discussed that BTL does not protect against STI and that barrier protection should be used. Patient understands and intends to comply.     Anesthesia and NICU aware. BMTZ x1 administered.  Stacey Mccullough 10/04/2021, 4:19 PM

## 2021-10-04 NOTE — Lactation Note (Signed)
This note was copied from a baby's chart. Lactation Consultation Note  Patient Name: Stacey Mccullough CRFVO'H Date: 10/04/2021   Age:33 hours  Lactation orders received and acknowledged. Mom still in PACU at the end of this LC shift, will F/U tomorrow for lactation initial assessment.     Kawanna Christley S Wynema Garoutte 10/04/2021, 6:00 PM

## 2021-10-04 NOTE — MAU Provider Note (Signed)
History     CSN: [id number]  Arrival date and time: 10/04/21 1302   Event Date/Time   First Provider Initiated Contact with Patient 10/04/21 1357      Chief Complaint  Patient presents with   Non-stress Test   HPI This is a 31 year old G2 P1-0-0-1 at 34 weeks and 4 days with a pregnancy complicated by and anti-c isoimmunizations.  The Dopplers have been normal, but has had BPP is 8 out of 10 past couple of days.  She went to MFM office today and had NST.  NST showed intermittent signs and was directed here for her evaluation.  She reports good fetal activity.  No leaking fluid or bleeding.  OB History     Gravida  2   Para  1   Term  1   Preterm      AB      Living  1      SAB      IAB      Ectopic      Multiple  0   Live Births  1           Past Medical History:  Diagnosis Date   Chronic epigastric pain    since Dec/comes and goes past eating   IBS (irritable bowel syndrome)    Lyme disease 1994   Nausea     Past Surgical History:  Procedure Laterality Date   BREAST SURGERY     COLONOSCOPY N/A 06/21/2013   Procedure: COLONOSCOPY;  Surgeon: Iva Boop, MD;  Location: [facility];  Service: Endoscopy;  Laterality: N/A;   TONSILLECTOMY     TONSILLECTOMY AND ADENOIDECTOMY  03/28/2005   UPPER GASTROINTESTINAL ENDOSCOPY     WISDOM TOOTH EXTRACTION      Family History  Problem Relation Age of Onset   Hypertension Mother    Stroke Mother    Asthma Brother        x 2   Asthma Sister    Cancer Paternal Uncle        Bone Sarcoma    Social History   Tobacco Use   Smoking status: Never   Smokeless tobacco: Never  Vaping Use   Vaping Use: Never used  Substance Use Topics   Alcohol use: Yes    Comment: soacial- 2 per month   Drug use: No    Allergies: No Known Allergies  Medications Prior to Admission  Medication Sig Dispense Refill Last Dose   acetaminophen (TYLENOL) 500 MG tablet Take 1,000 mg by mouth as needed for mild pain.        Prenatal Vit-Fe Fumarate-FA (MULTIVITAMIN-PRENATAL) 27-0.8 MG TABS tablet Take 1 tablet by mouth daily at 12 noon.       Review of Systems Physical Exam   Blood pressure 130/77, pulse 78, temperature 98.5 F (36.9 C), resp. rate 16, last menstrual period 02/04/2021, SpO2 100 %, currently breastfeeding.  Physical Exam Vitals reviewed.  Constitutional:      Appearance: Normal appearance.  Abdominal:     General: Abdomen is flat.     Palpations: Abdomen is soft.     Tenderness: There is no abdominal tenderness. There is no guarding.  Skin:    Capillary Refill: Capillary refill takes less than 2 seconds.  Neurological:     General: No focal deficit present.     Mental Status: She is alert.  Psychiatric:        Mood and Affect: Mood normal.  Behavior: Behavior normal.        Thought Content: Thought content normal.        Judgment: Judgment normal.    Results for orders placed or performed during the hospital encounter of 10/04/21 (from the past 24 hour(s))  Type and screen [facility]     Status: None   Collection Time: 10/04/21  1:35 PM  Result Value Ref Range   ABO/RH(D) O POS    Antibody Screen POS    Sample Expiration      10/07/2021,2359 Performed at [facility], [address] [place], [place] [address]   CBC     Status: Abnormal   Collection Time: 10/04/21  1:35 PM  Result Value Ref Range   WBC 7.4 4.0 - 10.5 K/uL   RBC 3.92 3.87 - 5.11 MIL/uL   Hemoglobin 12.3 12.0 - 15.0 g/dL   HCT 61.9 69.4 - 09.8 %   MCV 92.1 80.0 - 100.0 fL   MCH 31.4 26.0 - 34.0 pg   MCHC 34.1 30.0 - 36.0 g/dL   RDW 28.6 75.1 - 98.2 %   Platelets 106 (L) 150 - 400 K/uL   nRBC 0.0 0.0 - 0.2 %     MAU Course  Procedures NST:  Baseline: 140s  Variability: sinusoidal intermittently Accelerations:   Decelerations:  Contractions:    MDM Pertinent refills recommendation, continue prolonged monitoring, obtain KB, MCA Dopplers and AFI.  I  discussed results with Patient, Dr Grace Bushy, and patient's husband. MCA dopplers now 1.8 MoM. The recommendation is to deliver. Due to sinusoidal pattern, would recommend cesarean delivery.  Assessment and Plan   1. [redacted] weeks gestation of pregnancy   2. Non-reassuring electronic fetal monitoring tracing   3. 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 Thrombocytopenia (HCC)    Patient prepared for OR. Dr Vonna Kotyk notified and taking over care. NPO since 10:30a.  [name] 10/04/2021, 1:58 PM

## 2021-10-04 NOTE — Op Note (Signed)
C-Section Operative Note  Date: 10/04/21  Preoperative Diagnosis: IUP @ 34 4/7, 1:16 anti-c antibodies, fetal muscular VSD, thrombocytopenia, undesired fertility Postoperative Diagnosis: same as above; postpartum hemorrhage Procedure: PLTCS + BL distal salpingectomies Surgeon: Dr. Ellison Hughs  Assistant: Dr. Evalina Field Findings: Viable female infant weighing 2210g, APGARS pending. Pale infant but spontaneous cry and movements. Normal appearing uterus, bilateral fallopian tubes and ovaries. Specimens: placenta to pathology, cord blood for bilirubin, cord gases x2 EBL 1233cc IVF 2L UOP  Patient Course: G2P1001 @ 34 4/7. Antenatal monitoring today concerning for sinusoidal pattern, change in Dopplers over 24hrs from <1.5 MoM to 1.8 with BPP from 8/10 going to 6/10. Intermittent sinusoidal tracing still present during prolonged monitoring. Female. At this time, per MFM, move forward with PLTCS for nonreassuring FHR as well as BTL for undesired future fertility. BMTZ x1 given en route to OR  Consent:  R/B/A of cesarean section discussed with patient. Alternative would be vaginal delivery which would mean shorter postpartum stay and decreased risk of bleeding. Risks of section include infection of the uterus, pelvic organs, or skin, inadvertent injury to internal organs, such as bowel or bladder. If there is major injury, extensive surgery may be required. If injury is minor, it may be treated with relative ease. Discussed possibility of excessive blood loss and transfusion. If bleeding cannot be controlled using medical or minor surgical methods, a cesarean hysterectomy may be performed which would mean no future fertility. Patient accepts the possibility of blood transfusion, if necessary. Patient understands and agrees to move forward with section.  Dicussed with patient R/B/A of bilateral tubal ligation. Alternatives includes internal ligation, LARCs. Discussed increased risk of ectopic  pregnancy after procedure (1 in 3 pregnancies post-ligation can be ectopic) and that a pregnancy test should be taken if suspicion arises postpartum. Discussed risk of failure 1/100, backup method should be used for 2-4wks. Discussed R/B or surgical procedure including bleeding, infection, injury to surrounding organs. Patient agrees to blood products if needed. Discussed that BTL does not protect against STI and that barrier protection should be used. Patient understands and intends to comply.    Operative Procedure: Patient was taken to the operating room where epidural anesthesia was found to be adequate by Allis clamp test. She was prepped and draped in the normal sterile fashion in the dorsal supine position with a leftward tilt. An appropriate time out was performed. A Pfannenstiel skin incision was then made with the scalpel and carried through to the underlying layer of fascia by sharp dissection and Bovie cautery. The fascia was nicked in the midline and the incision was extended laterally with Mayo scissors. The superior aspect of the incision was grasped Coker clamps and dissected off the underlying rectus muscles. In a similar fashion the inferior aspect was dissected off the rectus muscles. Rectus muscles were separated in the midline and the peritoneal cavity entered bluntly. The peritoneal incision was then extended both superiorly and inferiorly with careful attention to avoid both bowel and bladder. The Alexis self-retaining wound retractor was then placed within the incision and the lower uterine segment exposed. The bladder flap was developed with Metzenbaum scissors and pushed away from the lower uterine segment. The lower uterine segment was then incised in a low transverse fashion and the cavity itself entered bluntly. The incision was extended bluntly. Amniotic sac was ruptured and fluid was noted to be clear in color. The infant's head was then lifted and delivered from the incision  without difficulty using  the standard movements. The remainder of the infant delivered and the nose and mouth bulb suctioned with the cord clamped and cut as well. The infant was handed off to NICU. Very pale appearing but spontaneous movements and crying noted.The placenta was then spontaneously expressed from the uterus and the uterus cleared of all clots and debris with moist lap sponge. No evidence of abruption during placental delivery. The uterine incision was then repaired in 2 layers the first layer was a running locked layer of 0-vicryl and the second an imbricating layer of the same suture. The tubes and ovaries were inspected and the gutters cleared of all clots and debris. The uterine incision was inspected and found to be hemostatic. Distal salpingectomies then carried out BL using Kelly clamp x2 and suture ligation. All instruments and sponges as well as the Alexis retractor were then removed from the abdomen. The peritoneum was then reapproximated using 2-0 vicryl suture.  The fascia was then closed with 0 Vicryl in a running fashion. The skin was closed with a subcuticular stitch of 4-0 Vicryl on a Keith needle and then reinforced with Dermabond and pressure dressing 2/2 slight oozing and known thrombocytopenia. At the conclusion of the procedure all instruments and sponge counts were correct. Patient was taken to the recovery room in good condition.  Cord pH: 7.3/7.24 (A/V)

## 2021-10-04 NOTE — Progress Notes (Signed)
MFM Brief Note  [name] is a 31 yo G2P1 who is here at 73 w4 d. She is seen at the request of Dr. Mindi Slicker for antenatal testing given 1:16 anti -c antibodies.   An NST was performed today that demonstrated a reactive tracing with intermittent sinusoidal pattern, defined as a uniform rise and fall of the fetal heart rate.  I discussed today's findings with [name] and her husband Stacey Mccullough who was at work in [place] and joined our conference).  I explained that sinusoidal patterns can be linked to massive maternal fetal hemorrhage. As a result I would recommend that they go to the MAU for a Kleihauer-Betke stain,  extended monitoring and limited exam assessing the amniotic fluid level and MCA Dopplers.  While in the hospital she had a non-reactive tracing, MCA's at 1.82 MoM and BPP 6/8. (-2 for movement)   I discussed this case with Dr.Shankar my partner and also Dr. Barbarann Mccullough at the [facility] fetal center. We agreed that risk for anemia is present and given the gestational age at 31 w 4d delivery without steroids is recommended. A cesarean delivery is indicated given our inability to truly interpret the fetal heart rate tracing.  Dr. Adrian Mccullough, Mr and Mrs Stacey Mccullough had a conference call to discuss the meaning of today's findings and my recommendation for delivery in weighing the risk and benefits of expectant management vs delivery. We discussed the absolute anemia is not 100% certain however, expectant management in the context of the data points obtained increases the risk for fetal hypoxia.   They understood today's couseling and agreed to move toward delivery.  I discussed this case also with Dr. Ellison Mccullough.  I spent 30 minutes with >50% in face to face counseling and care coordination.  Novella Olive, MD.

## 2021-10-04 NOTE — Anesthesia Preprocedure Evaluation (Signed)
Anesthesia Evaluation  Patient identified by MRN, date of birth, ID band Patient awake    Reviewed: Allergy & Precautions, Patient's Chart, lab work & pertinent test results  Airway Mallampati: I   Neck ROM: Full  Mouth opening: Pediatric Airway  Dental no notable dental hx.    Pulmonary neg pulmonary ROS,    Pulmonary exam normal - rhonchi       Cardiovascular negative cardio ROS Normal cardiovascular exam     Neuro/Psych negative psych ROS   GI/Hepatic negative GI ROS, Neg liver ROS,   Endo/Other  negative endocrine ROS  Renal/GU negative Renal ROS  negative genitourinary   Musculoskeletal negative musculoskeletal ROS (+)   Abdominal Normal abdominal exam  (+)   Peds  Hematology Thrombocytopenia Hx/o Lyme's disease   Anesthesia Other Findings   Reproductive/Obstetrics (+) Pregnancy                             Anesthesia Physical  Anesthesia Plan  ASA: 2  Anesthesia Plan: Spinal   Post-op Pain Management:    Induction:   PONV Risk Score and Plan: 3 and Ondansetron, Dexamethasone and Scopolamine patch - Pre-op  Airway Management Planned: Natural Airway, Simple Face Mask and Nasal Cannula  Additional Equipment:   Intra-op Plan:   Post-operative Plan:   Informed Consent: I have reviewed the patients History and Physical, chart, labs and discussed the procedure including the risks, benefits and alternatives for the proposed anesthesia with the patient or authorized representative who has indicated his/her understanding and acceptance.       Plan Discussed with: CRNA  Anesthesia Plan Comments:         Anesthesia Quick Evaluation

## 2021-10-04 NOTE — MAU Note (Signed)
.  Stacey Mccullough is a 31 y.o. at [redacted]w[redacted]d here in MAU reporting: Pt reports she was in the office and was told she had periods of sinusoidal on her fetal monitoring and was told to come in for further evaluation.  Denies vaginal bleeding or LOF. Reports +FM  Onset of complaint: TODAY  Pain score: denies  There were no vitals filed for this visit.    Lab orders placed from triage:   none

## 2021-10-04 NOTE — Transfer of Care (Signed)
Immediate Anesthesia Transfer of Care Note  Patient: Stacey Mccullough  Procedure(s) Performed: CESAREAN SECTION  Patient Location: PACU  Anesthesia Type:Spinal  Level of Consciousness: awake, alert  and oriented  Airway & Oxygen Therapy: Patient Spontanous Breathing  Post-op Assessment: Report given to RN and Post -op Vital signs reviewed and stable  Post vital signs: Reviewed and stable  Last Vitals:  Vitals Value Taken Time  BP 135/100 10/04/21 1757  Temp    Pulse 102 10/04/21 1759  Resp 12 10/04/21 1759  SpO2 100 % 10/04/21 1759  Vitals shown include unvalidated device data.  Last Pain:  Vitals:   10/04/21 1323  PainSc: 0-No pain         Complications: No notable events documented.

## 2021-10-04 NOTE — Progress Notes (Signed)
Infant Hgb returned as 5.2. Initial KB stain returned as 0. Clinically suspicious for fetal meternal hemorrhage, both MFM and NICU agree. Will redraw KB as wlel as antibody screen and titers BP 122/66   Pulse (!) 102   Temp 99.6 F (37.6 C) (Axillary)   Resp 16   Ht 5' (1.524 m)   Wt 63.9 kg   LMP 02/04/2021   SpO2 100%   BMI 27.52 kg/m  Pt doing well in PACU

## 2021-10-04 NOTE — Anesthesia Procedure Notes (Addendum)
Spinal  Patient location during procedure: OR Start time: 10/04/2021 4:42 PM End time: 10/04/2021 4:43 PM Reason for block: surgical anesthesia Staffing Performed: anesthesiologist  Anesthesiologist: Leilani Able, MD Performed by: Leilani Able, MD Authorized by: Leilani Able, MD   Preanesthetic Checklist Completed: patient identified, IV checked, site marked, risks and benefits discussed, surgical consent, monitors and equipment checked, pre-op evaluation and timeout performed Spinal Block Patient position: sitting Prep: DuraPrep and site prepped and draped Patient monitoring: continuous pulse ox and blood pressure Approach: midline Location: L3-4 Injection technique: single-shot Needle Needle type: Pencan  Needle gauge: 24 G Needle length: 10 cm Needle insertion depth: 5 cm Assessment Sensory level: T4 Events: CSF return

## 2021-10-05 ENCOUNTER — Encounter (HOSPITAL_COMMUNITY): Payer: Self-pay | Admitting: Obstetrics and Gynecology

## 2021-10-05 LAB — KLEIHAUER-BETKE STAIN
Fetal Cells %: 0 %
Quantitation Fetal Hemoglobin: 0 mL

## 2021-10-05 LAB — RPR: RPR Ser Ql: NONREACTIVE

## 2021-10-05 LAB — CBC
HCT: 29.1 % — ABNORMAL LOW (ref 36.0–46.0)
Hemoglobin: 9.9 g/dL — ABNORMAL LOW (ref 12.0–15.0)
MCH: 31.5 pg (ref 26.0–34.0)
MCHC: 34 g/dL (ref 30.0–36.0)
MCV: 92.7 fL (ref 80.0–100.0)
Platelets: 113 10*3/uL — ABNORMAL LOW (ref 150–400)
RBC: 3.14 MIL/uL — ABNORMAL LOW (ref 3.87–5.11)
RDW: 14 % (ref 11.5–15.5)
WBC: 12 10*3/uL — ABNORMAL HIGH (ref 4.0–10.5)
nRBC: 0 % (ref 0.0–0.2)

## 2021-10-05 NOTE — Anesthesia Postprocedure Evaluation (Signed)
Anesthesia Post Note  Patient: Stacey Mccullough  Procedure(s) Performed: CESAREAN SECTION     Patient location during evaluation: PACU Anesthesia Type: Spinal Level of consciousness: awake Pain management: pain level controlled Vital Signs Assessment: post-procedure vital signs reviewed and stable Respiratory status: spontaneous breathing Cardiovascular status: stable Postop Assessment: no headache, no backache, spinal receding, no apparent nausea or vomiting and patient able to bend at knees Anesthetic complications: no   No notable events documented.  Last Vitals:  Vitals:   10/05/21 0750 10/05/21 0755  BP: 102/62   Pulse: 63   Resp: 16   Temp:  (!) 36.4 C  SpO2: 99% 99%    Last Pain:  Vitals:   10/05/21 0755  TempSrc: Axillary  PainSc:                  Caren Macadam

## 2021-10-05 NOTE — Progress Notes (Signed)
Subjective: Postpartum Day 1: Cesarean Delivery Patient reports pain controlled, no nausea or vomiting, tolerating po and ambulating without difficulty  Objective: Vital signs in last 24 hours: Temp:  [97.5 F (36.4 C)-99.6 F (37.6 C)] 98.1 F (36.7 C) (07/11 1127) Pulse Rate:  [63-118] 71 (07/11 1127) Resp:  [12-25] 17 (07/11 1127) BP: (102-132)/(50-100) 121/76 (07/11 1127) SpO2:  [98 %-100 %] 100 % (07/11 1127) Weight:  [63.9 kg] 63.9 kg (07/10 1606)  Physical Exam:  General: alert, cooperative, and appears stated age Lochia: appropriate Uterine Fundus: firm Incision: dressing C/D/I DVT Evaluation: No evidence of DVT seen on physical exam.  Recent Labs    10/04/21 1335 10/05/21 0553  HGB 12.3 9.9*  HCT 36.1 29.1*    Assessment/Plan: Status post Cesarean section. Doing well postoperatively.  Continue current care.  Waynard Reeds, MD 10/05/2021, 1:04 PM

## 2021-10-05 NOTE — Lactation Note (Signed)
This note was copied from a baby's chart.  NICU Lactation Consultation Note  Patient Name: Boy Lanny Donoso [id number]'Z Date: 10/05/2021 Age:33 hours  Subjective Reason for consult: Initial assessment; NICU baby; Late-preterm 33-36.6wks; Infant < 6lbs; Breast augmentation  This LC and NICU RN shadow Jen visited with mom twice, first visit was at 1 pm but she had just gotten her lunch; and asked for a later visit. OB Specialty care RN Crystal had already given report on Ms. Yamamoto, she let us know that her R nipple was bleeding earlier and that she was very sore. Provided comfort gels and also assisted with hand expression, R nipple no longer bleeding at this time, per mom it only bled once when she pumped earlier today. This LC also assisted mom with pumping during [facility] consultation, provided a pumping top in size "S" she was very appreciative. Reviewed pumping schedule, pumping log, lactogenesis II and anticipatory guidelines.  Objective Infant data: Mother's Current Feeding Choice: Breast Milk  Infant feeding assessment NPO   Maternal data: G2P1102  C-Section, Low Transverse Significant Breast History:: (+) breast changes Current breast feeding challenges:: NICU admission Previous breastfeeding challenges?: Breast / nipple pain (had an episode of clogged ducts & mastitis) Does the patient have breastfeeding experience prior to this delivery?: Yes How long did the patient breastfeed?: 14 months Pumping frequency: q 3 hours Pumped volume: 2 mL (but 13 ml on the first pumping session) Flange Size: 24 Risk factor for low milk supply:: prematurity, infant separation Pump: Personal (Medela DEBP at home)  Assessment Infant: In NICU  Maternal: Breast are soft, no S/S of nipple trauma at this point; per mom pain shows improvement  Intervention/Plan Interventions: Breast feeding basics reviewed; DEBP; Education Tools: Pump; Flanges; Hands-free pumping top (Size "S") Pump Education:  Setup, frequency, and cleaning; Milk Storage  Plan of care: Encouraged mom to pump every 3 hours, at least 8 pumping sessions/24 hours Breast massage, hand expression and coconut oil (her husband will be getting one from the store) were also encouraged prior pumping She'll start using comfort gels, she's aware of not using them together with the coconut oil  No other support person at this time. All questions and concerns answered, family to contact [facility] services PRN.  Consult Status: NICU follow-up NICU Follow-up type: New admission follow up; Maternal D/C visit; Verify onset of copious milk; Verify absence of engorgement    Santresa Levett S Amal Renbarger 10/05/2021, 2:48 PM

## 2021-10-06 ENCOUNTER — Ambulatory Visit: Payer: No Typology Code available for payment source

## 2021-10-06 LAB — SURGICAL PATHOLOGY

## 2021-10-06 MED ORDER — IBUPROFEN 600 MG PO TABS: 600.0000 mg | ORAL_TABLET | Freq: Four times a day (QID) | ORAL | 1 refills | Status: DC | PRN

## 2021-10-06 MED ORDER — OXYCODONE-ACETAMINOPHEN 5-325 MG PO TABS: 1.0000 | ORAL_TABLET | ORAL | 0 refills | Status: AC | PRN

## 2021-10-06 NOTE — Progress Notes (Addendum)
Subjective: Postpartum Day 2: Cesarean Delivery Patient reports tolerating PO, + flatus, + BM, and no problems voiding. Pain well controlled with meds. Denies HA, SOB or CP. She is ambulating well. Pumping and getting a good amount of colostrum.  Considering discharge home toay Baby still in NICU - Hg improved s/p transfusion x 2 but bilirubin still elevated.     Objective: Vital signs in last 24 hours: Temp:  [97.8 F (36.6 C)-98.1 F (36.7 C)] 97.8 F (36.6 C) (07/12 0826) Pulse Rate:  [73-87] 80 (07/12 0826) Resp:  [17-18] 18 (07/12 0826) BP: (106-120)/(63-75) 115/75 (07/12 0826) SpO2:  [100 %] 100 % (07/12 0826)  Physical Exam:  General: alert, cooperative, and no distress Lochia: appropriate Uterine Fundus: firm Incision: healing well, no significant drainage DVT Evaluation: No evidence of DVT seen on physical exam. Negative Homan's sign.  Recent Labs    10/04/21 1335 10/05/21 0553  HGB 12.3 9.9*  HCT 36.1 29.1*    Assessment/Plan: Status post Cesarean section. Doing well postoperatively.  Continue current care May discharge to home later today if desires. Will need incision check in two weeks and postpartum visit in 6 weeks.  Cathrine Muster, DO 10/06/2021, 11:28 AM

## 2021-10-06 NOTE — Progress Notes (Signed)
Pt discharged to home  teaching reviewed  questions answered

## 2021-10-06 NOTE — Discharge Summary (Addendum)
Postpartum Discharge Summary  Date of Service updated      Patient Name: Stacey Mccullough DOB: 11-13-1990 MRN: [id number]  Date of admission: 10/04/2021 Delivery date:10/04/2021  Delivering provider: Carlisle Cater  Date of discharge: 10/06/2021  Admitting diagnosis: S/P primary low transverse C-section [Z98.891] Intrauterine pregnancy: [redacted]w[redacted]d     Secondary diagnosis:  Principal Problem:   S/P primary low transverse C-section  Additional problems: none    Discharge diagnosis: Preterm Pregnancy Delivered                                              Post partum procedures: n/a Augmentation: N/A Complications: None  Hospital course: Sceduled C/S   32 y.o. yo G2P1102 at [redacted]w[redacted]d was admitted to the hospital 10/04/2021 for scheduled cesarean section with the following indication: fetal indications  .Delivery details are as follows:  Membrane Rupture Time/Date: 5:05 PM ,10/04/2021   Delivery Method:C-Section, Low Transverse  Details of operation can be found in separate operative note.  Patient had an uncomplicated postpartum course.  She is ambulating, tolerating a regular diet, passing flatus, and urinating well. Patient is discharged home in stable condition on  10/06/21        Newborn Data: Birth date:10/04/2021  Birth time:5:06 PM  Gender:Female  Living status:Living  Apgars: ,  Weight:2210 g     Magnesium Sulfate received: No BMZ received: No Rhophylac:N/A  Physical exam  Vitals:   10/05/21 1515 10/05/21 1937 10/05/21 2348 10/06/21 0826  BP: 106/63 120/71 111/66 115/75  Pulse: 76 73 87 80  Resp: 18 17 17 18   Temp: 97.9 F (36.6 C) 98.1 F (36.7 C) 97.9 F (36.6 C) 97.8 F (36.6 C)  TempSrc: Oral Oral Oral Oral  SpO2: 100% 100% 100% 100%  Weight:      Height:       General: alert, cooperative, and no distress Lochia: appropriate Uterine Fundus: firm Incision: Healing well with no significant drainage, Dressing is clean, dry, and intact DVT Evaluation: No evidence  of DVT seen on physical exam. Labs: Lab Results  Component Value Date   WBC 12.0 (H) 10/05/2021   HGB 9.9 (L) 10/05/2021   HCT 29.1 (L) 10/05/2021   MCV 92.7 10/05/2021   PLT 113 (L) 10/05/2021      Latest Ref Rng & Units 10/12/2017   12:48 PM  CMP  Glucose 65 - 99 mg/dL 91   BUN 6 - 20 mg/dL 13   Creatinine 10/14/2017 - 1.00 mg/dL 4.00   Sodium 8.67 - 619 mmol/L 141   Potassium 3.5 - 5.2 mmol/L 4.6   Chloride 96 - 106 mmol/L 105   CO2 20 - 29 mmol/L 22   Calcium 8.7 - 10.2 mg/dL 9.2   Total Protein 6.0 - 8.5 g/dL 6.7   Total Bilirubin 0.0 - 1.2 mg/dL 0.4   Alkaline Phos 39 - 117 IU/L 64   AST 0 - 40 IU/L 16   ALT 0 - 32 IU/L 10    Edinburgh Score:    10/05/2021   11:27 AM  Edinburgh Postnatal Depression Scale Screening Tool  I have been able to laugh and see the funny side of things. 0  I have looked forward with enjoyment to things. 0  I have blamed myself unnecessarily when things went wrong. 0  I have been anxious or worried for no good reason.  0  I have felt scared or panicky for no good reason. 0  Things have been getting on top of me. 0  I have been so unhappy that I have had difficulty sleeping. 0  I have felt sad or miserable. 0  I have been so unhappy that I have been crying. 0  The thought of harming myself has occurred to me. 0  Edinburgh Postnatal Depression Scale Total 0      After visit meds:  Allergies as of 10/06/2021   No Known Allergies      Medication List     TAKE these medications    acetaminophen 500 MG tablet Commonly known as: TYLENOL Take 1,000 mg by mouth as needed for mild pain.   ibuprofen 600 MG tablet Commonly known as: ADVIL Take 1 tablet (600 mg total) by mouth every 6 (six) hours as needed for moderate pain or cramping.   multivitamin-prenatal 27-0.8 MG Tabs tablet Take 1 tablet by mouth daily at 12 noon.   oxyCODONE-acetaminophen 5-325 MG tablet Commonly known as: Percocet Take 1 tablet by mouth every 4 (four) hours as  needed for up to 7 days for severe pain.               Discharge Care Instructions  (From admission, onward)           Start     Ordered   10/06/21 0000  Discharge wound care:       Comments: May shower, pat dry, Keep clean and dry. Removed honeycomb dressing after 5 days   10/06/21 1133             Discharge home in stable condition Infant Feeding: Bottle and Breast Infant Disposition:NICU Discharge instruction: per After Visit Summary and Postpartum booklet. Activity: Advance as tolerated. Pelvic rest for 6 weeks.  Diet: routine diet Anticipated Birth Control: Unsure Postpartum Appointment:6 weeks Additional Postpartum F/U: Incision check 2 weeks Future Appointments:No future appointments. Follow up Visit:  Follow-up Information     [facility]. Schedule an appointment as soon as possible for a visit.   Why: Will need incision check in two weeks and postpartum visit in 6 weeks. Contact information: [address]  [address] [place] [address] [phone]                     10/06/2021 Cathrine Muster, DO

## 2021-10-06 NOTE — Lactation Note (Signed)
This note was copied from a baby's chart.  NICU Lactation Consultation Note  Patient Name: Stacey Mccullough [id number] Date: 10/06/2021 Age:33 hours  Subjective Reason for consult: Follow-up assessment; NICU baby; Infant < 6lbs; Breast augmentation; Maternal discharge; Late-preterm 34-36.6wks (subpectoral)  Visited with mom of 48 63/4 weeks old (adjusted) NICU female, she's a P2 and experienced breastfeeding. Ms. Ludvigsen might be going home either today or tomorrow; reviewed discharge education, pump settings, pumping log and anticipatory guidelines. She plans to room in with baby after her discharge to have access to the Symphony pump; she's been using an app to track her pumping progress, praised her for her efforts. She reported that the pain she was feeling yesterday is better today and shows improvement, she hasn't seen any "pink milk" the last 24 hours; nipple is no longer bleeding.  Objective Infant data: Mother's Current Feeding Choice: Breast Milk and Donor Milk  Infant feeding assessment On scheduled gavage feedings   Maternal data: G2P1102  C-Section, Low Transverse Significant Breast History:: (+) breast changes Current breast feeding challenges:: NICU admission Previous breastfeeding challenges?: Breast / nipple pain (had an episode of clogged ducts & mastitis) Does the patient have breastfeeding experience prior to this delivery?: Yes How long did the patient breastfeed?: 14 months Pumping frequency: 7 times/24 hours Pumped volume: 9 mL Flange Size: 24 Risk factor for low milk supply:: prematurity, infant separation Pump: Personal (Medela DEBP at home)  Assessment Infant: In NICU  Maternal: Milk volume: Normal  Intervention/Plan Interventions: Breast feeding basics reviewed; DEBP; Education; "The NICU and Your Baby" book; LC Services brochure Tools: Pump; Flanges; Hands-free pumping top (Size "S") Pump Education: Setup, frequency, and cleaning; Milk Storage  Plan  of care: Encouraged mom to continue pumping every 3 hours, at least 8 pumping sessions/24 hours Breast massage, hand expression and coconut oil (her husband already got coconut oil from the store). She'll take all pump parts to baby's room after her discharge She'll switch her pump settings from initiation to expression mode after she's getting at least 20 ml combined from both breasts   FOB present and supportive. All questions and concerns answered, family to contact [facility] services PRN.  Consult Status: NICU follow-up NICU Follow-up type: Verify onset of copious milk; Verify absence of engorgement; Weekly NICU follow up    [employer] 10/06/2021, 11:04 AM

## 2021-10-06 NOTE — Discharge Instructions (Signed)
Call office with any concerns [phone] 

## 2021-10-07 ENCOUNTER — Ambulatory Visit: Payer: Self-pay

## 2021-10-07 NOTE — Lactation Note (Signed)
This note was copied from a baby's chart.  NICU Lactation Consultation Note  Patient Name: Stacey Mccullough [id number] Date: 10/07/2021 Age:33 hours   Subjective Reason for consult: Follow-up assessment; NICU baby; Late-preterm 34-36.6wks; Hyperbilirubinemia  Lactation followed up with [name]. She reports that she has been breast pumping consistently, and her milk has transitioned. She denies engorgement at this time. I provided engorgement management education, and I provided her with additional comfort gels upon request. She is experiencing mild sensitivity while pumping. I recommended starting her pressure at a lower setting, and increasing suction, as comfortable.  Objective Infant data: Mother's Current Feeding Choice: Breast Milk and Donor Milk  Infant feeding assessment Scale for Readiness: 1  Maternal data: G2P1102  C-Section, Low Transverse  Current breast feeding challenges:: NICU; infant separation  Does the patient have breastfeeding experience prior to this delivery?: Yes How long did the patient breastfeed?: 14 months  Pumping frequency: q3 hours, as much as possible Pumped volume: 90 mL   Pump: Personal  Assessment  Maternal: Milk volume: Normal   Intervention/Plan Interventions: Breast feeding basics reviewed; Education; Comfort gels  Tools: Pump Pump Education: Setup, frequency, and cleaning  Plan: Consult Status: NICU follow-up  NICU Follow-up type: Verify onset of copious milk; Verify absence of engorgement    Stacey Mccullough 10/07/2021, 11:32 AM

## 2021-10-08 LAB — TYPE AND SCREEN
ABO/RH(D): O POS
Antibody Screen: POSITIVE
Donor AG Type: NEGATIVE
Donor AG Type: NEGATIVE
Unit division: 0

## 2021-10-08 LAB — BPAM RBC
Blood Product Expiration Date: 202307172359
Blood Product Expiration Date: 202307182359
Unit Type and Rh: 5100
Unit Type and Rh: 5100

## 2021-10-11 ENCOUNTER — Ambulatory Visit: Payer: Self-pay

## 2021-10-11 NOTE — Lactation Note (Signed)
This note was copied from a baby's chart.    NICU Lactation Consultation Note  Patient Name: Stacey Mccullough JASNK'N Date: 10/11/2021 Age:33 days   Subjective Reason for consult: Mother's request; Follow-up assessment; NICU baby; Late-preterm 34-36.6wks  Lactation reported to Ms. Grabinski's room upon request to provide some additional tubing (which was missing from the pump this morning). I also observed her pump and discussed her concern with an area of inflammation on the upper quadrant of the right breast.   I observed her pump, and provided a hands-free pumping top.  I noted that after the session,the area still felt firm, and there was less milk expressed from the right side.  I recommended ice for comfort, light massage (lymphatic drainage massage), rest and consistent pumping (but not over-pumping). I discouraged deep tissue massage and vibration massage.  I provided information on [employer] to investigate the use of lecithin treatment for recurrent plugged ducts. Ms. Lookabaugh has had these with her previous child.  I recommended pumping q3 hours and to rest. I will ask lactation to follow up with her this week.  Objective Infant data: Mother's Current Feeding Choice: Breast Milk  Infant feeding assessment Scale for Readiness: 3  Maternal data: G2P1102  C-Section, Low Transverse  Current breast feeding challenges:: NICU; plugged milk duct  No data recorded Does the patient have breastfeeding experience prior to this delivery?: Yes  Pumped volume: 120 mL (120 - 150) Flange Size: 27   Pump: Personal (Medela pump in style)   Maternal: Milk volume: Normal   Intervention/Plan Interventions: Breast feeding basics reviewed; Hand express; DEBP; Education; Ice  Tools: Pump; Hands-free pumping top Pump Education: Setup, frequency, and cleaning  Plan: Consult Status: NICU follow-up  NICU Follow-up type: Weekly NICU follow up    Walker Shadow 10/11/2021,  11:53 AM

## 2021-10-12 ENCOUNTER — Telehealth (HOSPITAL_COMMUNITY): Payer: Self-pay | Admitting: *Deleted

## 2021-10-12 ENCOUNTER — Ambulatory Visit: Payer: Self-pay

## 2021-10-12 NOTE — Lactation Note (Signed)
This note was copied from a baby's chart. Lactation Consultation Note Mother continues to pump frequently and use strategies to decrease engorgement. Today she has s/s of mastitis. I encouraged mother to f/u with OB or present at MAU.   Patient Name: Stacey Mccullough [id number] Date: 10/12/2021   Age:33 days   Elder Negus 10/12/2021, 12:10 PM

## 2021-10-12 NOTE — Telephone Encounter (Signed)
Left phone voicemail message.  Duffy Rhody, RN 10-12-2021 at 11:26am

## 2021-10-13 ENCOUNTER — Ambulatory Visit: Payer: Self-pay

## 2021-10-13 NOTE — Lactation Note (Signed)
This note was copied from a baby's chart. Lactation Consultation Note Mother is being treated for mastitis. She continues to pump frequently and use relief strategies. We discussed IDF when infant is ready. Mom is aware of LC support in NICU and will request observed feeding if infant wakes during feeding time today.  Patient Name: Stacey Mccullough [id number] Date: 10/13/2021   Age:33 days  Stacey Mccullough 10/13/2021, 10:59 AM

## 2021-10-13 NOTE — Lactation Note (Signed)
This note was copied from a baby's chart.  NICU Lactation Consultation Note  Patient Name: Stacey Mccullough [id number] Date: 10/13/2021 Age:33 days   Subjective Reason for consult: Breastfeeding assistance LC returned to assist with bf'ing. Infant was in a quiet awake state but did not demonstrate feeding cues/interest. I left mom and baby sts during gavage feeding after providing ed and reviewing feeding norms for age.  Objective Infant data: Mother's Current Feeding Choice: Breast Milk  Infant feeding assessment Scale for Readiness: 3     Maternal data: G2P1102  C-Section, Low Transverse  Pump: Personal (Medela pump in style)  Assessment Infant: LATCH Score: 5   Intervention/Plan Interventions: Education; Infant Driven Feeding Algorithm education  Plan: Consult Status: NICU follow-up  NICU Follow-up type: Verify absence of engorgement  Mother to continue offering breast at feeding times.  Elder Negus 10/13/2021, 12:15 PM

## 2021-10-15 ENCOUNTER — Other Ambulatory Visit: Payer: No Typology Code available for payment source

## 2021-10-15 ENCOUNTER — Ambulatory Visit: Payer: No Typology Code available for payment source

## 2021-10-17 ENCOUNTER — Ambulatory Visit: Payer: Self-pay

## 2021-10-17 NOTE — Lactation Note (Signed)
This note was copied from a baby's chart.  NICU Lactation Consultation Note  Patient Name: Stacey Mccullough [id number] Date: 10/17/2021 Age:33 days  Subjective Reason for consult: Follow-up assessment; NICU baby; Breast augmentation; Late-preterm 34-36.6wks; Mother's request; Infant < 6lbs (subpectoral)  Visited with mom of 93 31/49 weeks old (adjusted) NICU female, she's a P2 and experienced breastfeeding. Ms. Seaberry reported that she started Dicloxacillin 500 mg q 6 hours treatment for her mastitis on 10/12/21 and the symptoms are gone by now. She also started lecithin and voiced it's also working for her. Encouraged to finish the 7 days antibiotic treatment and continue taking lecithin. R breast still cracked since mastitis, transient soreness only during pumping/latching, but none at rest. Ms. Kithcart started taking baby "[name]" to breast last week and wanted to know when she can stop pumping prior feedings. Reviewed IDF 1/2 and advised to continue with current plan until SLP can reassess.   Objective Infant data: Mother's Current Feeding Choice: Breast Milk  Infant feeding assessment Scale for Readiness: 4  Maternal data: G2P1102  C-Section, Low Transverse Pumping frequency: 7-8 times/24 hours Pumped volume: 150 mL (150-165 ml) Flange Size: 27 Pump: Personal (Medela pump in style)  Assessment Infant: IDF 1, lick and learn  Maternal: Milk volume: Normal  Intervention/Plan Interventions: DEBP; Education; Infant Driven Feeding Algorithm education Tools: Pump; Flanges Pump Education: Setup, frequency, and cleaning; Milk Storage  Plan of care: Encouraged mom to continue pumping every 3 hours, ideally 8 pumping sessions/24 hours She'll continue pumping prior feedings at the breast She'll continue working on pre-feeding activities such as holding baby STS during feedings, paci dips and finger feeding   No other support person at this time. All questions and concerns answered, family  to contact [facility] services PRN.  Consult Status: NICU follow-up NICU Follow-up type: Assist with IDF-1 (Mother to pre-pump before breastfeeding); Weekly NICU follow up   Elchanan Bob Venetia Constable 10/17/2021, 2:52 PM

## 2021-10-18 ENCOUNTER — Ambulatory Visit: Payer: Self-pay

## 2021-10-18 NOTE — Lactation Note (Signed)
This note was copied from a baby's chart. Lactation Consultation Note  Patient Name: Boy Shaine Mount JMEQA'S Date: 10/18/2021 Reason for consult: Follow-up assessment;NICU baby;Late-preterm 34-36.6wks;Mother's request;Breastfeeding assistance Age:33 wk.o.  Visited with mom of 5 72/16 weeks old (adjusted) NICU female, she requested a feeding assist for the 12 pm feeding. MOB wanted to try a NS # 24 to see baby would latch more consistently and he did (see LATCH score). No adverse events to report during this 5 minutes feeding. Ms. Aristizabal asked if she can start pumping less of her volumes instead of completely emptying the breast, consult with SLP Dacia and we'll be implementing this plan on the next feeding. Ms. Soutar was educated on IDF 1/2 and stressors signs to watch for in case the feeding needs to be interrupted.  Feeding Mother's Current Feeding Choice: Breast Milk  LATCH Score Latch: Grasps breast easily, tongue down, lips flanged, rhythmical sucking. (with NS # 24; baby would take several breast in between SSB)  Audible Swallowing: A few with stimulation (with compressions, need to prime NS # 24. NNS was the predominant pattern during this feeding but there were audible swallows during the first 5 minutes)  Type of Nipple: Everted at rest and after stimulation  Comfort (Breast/Nipple): Soft / non-tender  Hold (Positioning): Assistance needed to correctly position infant at breast and maintain latch.  LATCH Score: 8  Lactation Tools Discussed/Used Tools: Nipple Shields Nipple shield size: 24  Interventions Interventions: Assisted with latch;Skin to skin;Breast massage;Breast compression;Adjust position;Support pillows;Education;Infant Driven Feeding Algorithm education  Plan of care Encouraged mom to continue pumping every 3 hours, ideally 8 pumping sessions/24 hours She'll continue pumping prior feedings at the breast; about 1/2 of her volumes She'll continue working on  pre-feeding activities such as holding baby STS during feedings, paci dips and finger feeding She'll start taking baby to a semi-pumped breast and watch for stress signs, will report to NICU RN/SLP or LC   No other support person at this time. All questions and concerns answered, family to contact [facility] services PRN.  Consult Status Consult Status: NICU follow-up Date: 10/18/21 Follow-up type: In-patient   Ephrata Verville Venetia Constable 10/18/2021, 12:56 PM

## 2021-10-19 ENCOUNTER — Ambulatory Visit: Payer: Self-pay

## 2021-10-19 NOTE — Lactation Note (Signed)
This note was copied from a baby's chart. Lactation Consultation Note LC to room for infant feeding f/u. Parent completed bf'ing prior to my arrival. Infant bf for 5-6 minutes and gavage was reduced to 2/3. LC will plan f/u tomorrow to further assess prn.   Patient Name: Stacey Mccullough [id number] Date: 10/19/2021   Age:33 wk.o.   Elder Negus 10/19/2021, 4:00 PM

## 2021-10-26 ENCOUNTER — Ambulatory Visit: Payer: Self-pay

## 2021-10-26 NOTE — Lactation Note (Signed)
This note was copied from a baby's chart.  NICU Lactation Consultation Note  Patient Name: Stacey Mccullough IAXKP'V Date: 10/26/2021 Age:33 wk.o.  Subjective Reason for consult: Follow-up assessment; NICU baby; Early term 55-38.6wks; Breast augmentation  Visited with mom of 33 68/26 weeks old (adjusted) NICU female, she's a P2 and experienced breastfeeding. [name] has been putting baby Stacey Mccullough Pert to breast and practicing some independent nursing; she voiced baby is doing well at the breast and she hears swallows but if he does well with a particular feeding he might be exhausted for the next feeding. She has also noted that her supply has slightly increased. Reviewed normalcy and feeding patters for ETI; [name] also voiced that he favors the L breast and has noticed that the R side (where baby is not nursing) had become dry and "crusty". She looked it up and saw it might be due to exclusively pumping (and not putting baby to breast) on that side. Reviewed PO feeds, IDF 1/2 and feeding plan.  Objective Infant data: Mother's Current Feeding Choice: Breast Milk  Infant feeding assessment Scale for Readiness: 2 Scale for Quality: 2  Maternal data: G2P1102  C-Section, Low Transverse Pumping frequency: 7-8 times/24 hours Pumped volume: 165 mL (165-180 ml; 120 ml if pumping after baby nurses at the breast) Flange Size: 27 Pump: Personal (Medela pump in style)  Assessment Infant: LATCH Score: 10 (per RN)  Maternal: Milk volume: Normal  Intervention/Plan Interventions: Adjust position; Support pillows; Position options  Tools: Pump; Flanges Pump Education: Setup, frequency, and cleaning; Milk Storage  Plan of care: Encouraged mom  to continue pumping every 3 hours, ideally 8 pumping sessions/24 hours She'll start putting baby to R breast as well as L She'll continue taking lecithin 2 tablets/24 hours to prevent clogged ducts Parents will continue working on bottle feedings   No  other support person at this time. All questions and concerns answered, family to contact [facility] services PRN.  Consult Status: NICU follow-up  NICU Follow-up type: Assist with IDF-2 (Mother does not need to pre-pump before breastfeeding); Weekly NICU follow up   Siddiq Kaluzny Venetia Constable 10/26/2021, 12:42 PM

## 2021-10-28 ENCOUNTER — Ambulatory Visit: Payer: Self-pay

## 2021-10-28 NOTE — Lactation Note (Signed)
This note was copied from a baby's chart.  NICU Lactation Consultation Note  Patient Name: Stacey Mccullough [id number] Date: 10/28/2021 Age:33 wk.o.   Subjective Reason for consult: Follow-up assessment; NICU baby; Term  Lactation followed up with [name]. She was bottle feeding baby Stacey Mccullough at this visit. We discussed Stacey Mccullough's progress with feedings. She is breastfeeding him on demand and offering 3 bottles/day for additional calories.  Stacey Mccullough states that Stacey Mccullough is latching well. I recommended switch nursing him if he becomes sleepy at the breast prior to obtaining a full feeding. He tends to prefer the left breast, but he will latch to the right side.   Stacey Mccullough states that she is continuing with a maintenance dose of lecithin to prevent plugged milk ducts.  Upon discharge. Stacey Mccullough will follow up with a pediatrician in [place]. I made her aware of a breastfeeding support group located in [facility]), as well as [facility]'s breastfeeding support group, which meets on Thursdays at 12 pm on the ground floor of the [facility].  Objective Infant data: Mother's Current Feeding Choice: Breast Milk  Infant feeding assessment Scale for Readiness: 1 Scale for Quality: 2  Maternal data: G2P1102  C-Section, Low Transverse Current breast feeding challenges:: NICU   Does the patient have breastfeeding experience prior to this delivery?: Yes  Pumping frequency: after feedings - baby is ad lib   Pump: Personal (Medela pump in style)  Assessment Infant: LATCH Score: 10  Maternal: Milk volume: Normal   Intervention/Plan Interventions: Breast feeding basics reviewed; Education  Pump Education: Setup, frequency, and cleaning  Plan: Consult Status: NICU follow-up  NICU Follow-up type: Weekly NICU follow up; Assist with IDF-2 (Mother does not need to pre-pump before breastfeeding)    Walker Shadow 10/28/2021, 9:32 AM

## 2021-10-29 ENCOUNTER — Ambulatory Visit: Payer: Self-pay

## 2021-10-29 NOTE — Lactation Note (Signed)
This note was copied from a baby'Stacey chart.  NICU Lactation Consultation Note  Patient Name: Stacey Mccullough Date: 10/29/2021 Age:33 wk.o.  Subjective Reason for consult: Follow-up assessment; NICU baby; Early term 27-38.6wks; Infant < 6lbs; Breast augmentation  Visited with mom of 33 27/67 weeks old (adjusted) NICU female, she'Stacey a P2 and experienced breastfeeding. Stacey Mccullough and FOB are taking baby Stacey Mccullough home today, reviewed discharge education; she politely declined LC OP referral but said she'll continue working on breastfeeding after baby'Stacey discharge and will schedule an appt with LC OP if needed. [facility] brochure was also provided. Stacey Mccullough also reported that her R nipple looks better and less "crusty" since she started putting baby to breast. Encouraged to continue pumping after feedings at the breast to protect her supply; parents will also offer at least 3 bottles/day for caloric count. FOB present, all questions and concerns answered, family to contact [facility] services PRN.  Objective Infant data: Mother'Stacey Current Feeding Choice: Breast Milk  Infant feeding assessment Scale for Readiness: 1 Scale for Quality: 2  Maternal data: G2P1102  C-Section, Low Transverse Current breast feeding challenges:: NICU Does the patient have breastfeeding experience prior to this delivery?: Yes Pumping frequency: 7-8 times/24 hours, usually after feedings at the breast Pumped volume: 135 mL (135-150 ml) Flange Size: 27 Pump: Personal (Medela pump in style)  Assessment Infant: Feeding Status: Ad lib  Maternal: Milk volume: Normal  Intervention/Plan Interventions: Education Tools: Pump; Flanges Pump Education: Setup, frequency, and cleaning; Milk Storage  Plan: Consult Status: Complete  NICU Follow-up type: Weekly NICU follow up; Assist with IDF-2 (Mother does not need to pre-pump before breastfeeding)   Stacey Mccullough Stacey Mccullough 10/29/2021, 4:30 PM

## 2022-05-04 ENCOUNTER — Other Ambulatory Visit (HOSPITAL_COMMUNITY): Payer: Self-pay

## 2022-05-04 MED ORDER — AMOXICILLIN-POT CLAVULANATE 875-125 MG PO TABS
875.0000 mg | ORAL_TABLET | Freq: Two times a day (BID) | ORAL | 0 refills | Status: DC
  Filled 2022-05-04: qty 20, 10d supply, fill #0

## 2022-05-23 ENCOUNTER — Other Ambulatory Visit (HOSPITAL_COMMUNITY): Payer: Self-pay

## 2022-05-23 MED ORDER — VALACYCLOVIR HCL 1 G PO TABS
1000.0000 mg | ORAL_TABLET | Freq: Three times a day (TID) | ORAL | 0 refills | Status: DC
  Filled 2022-05-23: qty 21, 7d supply, fill #0

## 2022-11-02 ENCOUNTER — Other Ambulatory Visit (HOSPITAL_COMMUNITY): Payer: Self-pay

## 2022-11-02 MED ORDER — ONDANSETRON 4 MG PO TBDP
4.0000 mg | ORAL_TABLET | ORAL | 1 refills | Status: DC | PRN
  Filled 2022-11-02: qty 30, 5d supply, fill #0

## 2022-12-08 ENCOUNTER — Other Ambulatory Visit (HOSPITAL_COMMUNITY): Payer: Self-pay

## 2022-12-08 MED ORDER — ERYTHROMYCIN 5 MG/GM OP OINT
TOPICAL_OINTMENT | OPHTHALMIC | 0 refills | Status: DC
  Filled 2022-12-08: qty 3.5, 7d supply, fill #0

## 2023-01-31 ENCOUNTER — Other Ambulatory Visit (HOSPITAL_COMMUNITY): Payer: Self-pay

## 2023-01-31 MED ORDER — FLUCONAZOLE 150 MG PO TABS
150.0000 mg | ORAL_TABLET | ORAL | 0 refills | Status: DC
  Filled 2023-01-31: qty 2, 6d supply, fill #0

## 2023-03-06 ENCOUNTER — Other Ambulatory Visit (HOSPITAL_COMMUNITY): Payer: Self-pay

## 2023-03-06 MED ORDER — AMOXICILLIN-POT CLAVULANATE 875-125 MG PO TABS
1.0000 | ORAL_TABLET | Freq: Two times a day (BID) | ORAL | 0 refills | Status: DC
  Filled 2023-03-06: qty 20, 10d supply, fill #0

## 2023-03-16 ENCOUNTER — Other Ambulatory Visit (HOSPITAL_COMMUNITY): Payer: Self-pay

## 2023-07-25 ENCOUNTER — Other Ambulatory Visit (HOSPITAL_COMMUNITY): Payer: Self-pay

## 2023-07-25 MED ORDER — NORETHINDRONE 0.35 MG PO TABS
1.0000 | ORAL_TABLET | Freq: Every day | ORAL | 1 refills | Status: DC
  Filled 2023-07-25: qty 84, 84d supply, fill #0

## 2023-07-31 ENCOUNTER — Encounter: Payer: Self-pay | Admitting: Neurology

## 2023-08-01 NOTE — Progress Notes (Unsigned)
 NEUROLOGY CONSULTATION NOTE  Stacey Mccullough MRN: [id number] DOB: 1990-05-29  Referring provider: Sandra Crouch, DO Primary care provider: No PCP  Reason for consult:  migraines  Assessment/Plan:   Migraine with aura, without status migrainosus, not intractable  At this time, I wouldn't start a preventative medication and would hold off on the progesterone therapy for now.  I think this flare up was triggered by the increased stress and lack of eating related to her father's death and estate process, as well as the hormonal changes that accompanied having discontinued breastfeeding.  Would monitor for now and see if migraines decrease as her situation stabilizes.   Excedrin (or ibuprofen  or Tylenol ) for acute migraine management. May continue MigreLief.  Consider adding CoQ10 300mg  daily as well. Limit use of pain relievers to no more than 9 days out of the month to prevent risk of rebound or medication-overuse headache. Keep headache diary Lifestyle modification:  migraine diet, hydration, exercise, sleep hygiene If headaches do not normalize or increase in frequency, she is advised to follow up.    Total time spent in chart and face to face with patient:  56 minutes.   Subjective:  Stacey Mccullough is a 33 year old right-handed female with IBS who presents for migraines.  History supplemented by referring provider's note.  Patient has history of migraine with aura, which were infrequent.   Location:  band-like Quality:  pounding Intensity:  7-8/10 Aura:  preceded by seeing zigzag pattern and flashing lights for 5 minutes. Prodrome:  anxiety/sensation of impending doom, mild dizziness Postdrome:  tension/scalp soreness, stiff neck, brain fog, photosensitivity and osmophobia lasting for a day. Associated symptoms:  Nausea, photophobia, phonophobia, diarrhea.  She denies associated unilateral numbness or weakness. Duration:  1 hour with treatment (otherwise several hours) Relieving  factors:  rest, caffeine (cola) They have always been infrequent, she had 3 migraines in a 3 year period.  Over the last month and a half, she has had increased frequency, a cluster of 3 migraines in a 5 week period.    The visual aura seemed localized to her right eye.  Also, the postdrome lasted up to 5 days.  Her last migraine was about a week ago.  Over the past couple of days, she has had some dizziness associated with a dull headache.  Head feels stuffy.  With quick head movements or change in position, she feels a sense of off-balance but not frank spinning.  The dizziness lasts a few seconds but the headache may last 1-2 hours.  Responds to the Epley maneuver.    When this cluster of migraines started, she had been under a great deal of stress.  Her father was ill and passed away.  She is now handling the estate process.  During this period of time, she had not been eating much.  She also had just stopped nursing her son after 15 months.  Her GYN checked her prolactin level which was slightly low.  She was prescribed Camila  but hasn't started it yet.     Past NSAIDS/analgesics:  none Past abortive triptans:  none Past abortive ergotamine:  none Past muscle relaxants:  none Past anti-emetic:  Zofran -ODT 4mg  Past antihypertensive medications:  none Past antidepressant medications:  none Past anticonvulsant medications:  none Past anti-CGRP:  none Past vitamins/Herbal/Supplements:  none Past antihistamines/decongestants:  Benadryl  Other past therapies:  none  Current NSAIDS/analgesics:  Excedrin Migraine, ibuprofen , Tylenol  Current triptans:  none Current ergotamine:  none Current anti-emetic:  none  Current muscle relaxants:  none Current Antihypertensive medications:  none Current Antidepressant medications:  none Current Anticonvulsant medications:  none Current anti-CGRP:  none Current Vitamins/Herbal/Supplements:  Just started MigreLief (Mg, B2, feverfew) which she thinks has  been helpful.   Current Antihistamines/Decongestants:  none Other therapy:  none Birth control:  Camila  (not started yet)  Caffeine:  1 cup coffee daily (rarely 2 cups).  Sometimes a soda in the evening. Diet:  60-80 oz water daily.  Tries not skip meals (sometimes breakfast).  No specific diet. Exercise:  not routine but active at work and caring for her children. 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 She has 2 children     PAST MEDICAL HISTORY: Past Medical History:  Diagnosis Date   Chronic epigastric pain    since Dec/comes and goes past eating   IBS (irritable bowel syndrome)    Lyme disease 1994   Nausea     PAST SURGICAL HISTORY: Past Surgical History:  Procedure Laterality Date   BREAST SURGERY     CESAREAN SECTION N/A 10/04/2021   Procedure: CESAREAN SECTION;  Surgeon: Gaspar Karma, MD;  Location: [facility] LD ORS;  Service: Obstetrics;  Laterality: N/A;   COLONOSCOPY N/A 06/21/2013   Procedure: COLONOSCOPY;  Surgeon: Kenney Peacemaker, MD;  Location: [facility] ENDOSCOPY;  Service: Endoscopy;  Laterality: N/A;   TONSILLECTOMY     TONSILLECTOMY AND ADENOIDECTOMY  03/28/2005   UPPER GASTROINTESTINAL ENDOSCOPY     WISDOM TOOTH EXTRACTION      MEDICATIONS: Current Outpatient Medications on File Prior to Visit  Medication Sig Dispense Refill   acetaminophen  (TYLENOL ) 500 MG tablet Take 1,000 mg by mouth as needed for mild pain.      amoxicillin -clavulanate (AUGMENTIN ) 875-125 MG tablet Take 1 tablet by mouth every 12 (twelve) hours. 20 tablet 0   amoxicillin -clavulanate (AUGMENTIN ) 875-125 MG tablet Take 1 tablet by mouth every 12 (twelve) hours. 20 tablet 0   erythromycin  ophthalmic ointment Apply in each affected eye as directed 4 times a day 3.5 g 0   fluconazole  (DIFLUCAN ) 150 MG tablet Take 1 tablet (150 mg  total) by mouth every 3 (three) days as directed for 6 days 2 tablet 0   ibuprofen  (ADVIL ) 600 MG tablet Take 1 tablet (600 mg total) by mouth every 6 (six) hours as needed for moderate pain or cramping. 40 tablet 1   norethindrone  (CAMILA ) 0.35 MG tablet Take 1 tablet (0.35 mg total) by mouth daily. 84 tablet 1   ondansetron  (ZOFRAN -ODT) 4 MG disintegrating tablet Dissolve 1 tablet (4 mg total) by mouth every 4-6 hours as needed for nausea 30 tablet 1   Prenatal Vit-Fe Fumarate-FA (MULTIVITAMIN-PRENATAL) 27-0.8 MG TABS tablet Take 1 tablet by mouth daily at 12 noon.     valACYclovir  (VALTREX ) 1000 MG tablet Take 1 tablet (1,000 mg total) by mouth every 8 (eight) hours for 7 days 21 tablet 0   No current facility-administered medications on file prior to visit.    ALLERGIES: No Known Allergies  FAMILY HISTORY: Family History  Problem Relation Age of Onset   Hypertension Mother    Stroke Mother    Asthma Brother        x 2   Asthma Sister    Cancer Paternal Uncle  Bone Sarcoma    Objective:  Blood pressure 119/81, pulse 71, height 5' (1.524 m), weight 112 lb 3.2 oz (50.9 kg), last menstrual period 07/15/2023, SpO2 100%, unknown if currently breastfeeding. General: No acute distress.  Patient appears well-groomed.   Head:  Normocephalic/atraumatic Eyes:  fundi examined but not visualized Neck: supple, no paraspinal tenderness, full range of motion Heart: regular rate and rhythm Neurological Exam: Mental status: alert and oriented to person, place, and time, speech fluent and not dysarthric, language intact. Cranial nerves: CN I: not tested CN II: pupils equal, round and reactive to light, visual fields intact CN III, IV, VI:  full range of motion, no nystagmus, no ptosis CN V: facial sensation intact. CN VII: upper and lower face symmetric CN VIII: hearing intact CN IX, X: gag intact, uvula midline CN XI: sternocleidomastoid and trapezius muscles intact CN XII: tongue  midline Bulk & Tone: normal, no fasciculations. Motor:  muscle strength 5/5 throughout Sensation:  Pinprick and vibratory sensation intact. Deep Tendon Reflexes:  2+ throughout,  toes downgoing.   Finger to nose testing:  Without dysmetria.   Gait:  Normal station and stride.  Romberg negative.    Thank you for allowing me to take part in the care of this patient.  Janne Members, DO

## 2023-08-02 ENCOUNTER — Ambulatory Visit (INDEPENDENT_AMBULATORY_CARE_PROVIDER_SITE_OTHER): Admitting: Neurology

## 2023-08-02 ENCOUNTER — Encounter: Payer: Self-pay | Admitting: Neurology

## 2023-08-02 VITALS — BP 119/81 | HR 71 | Ht 60.0 in | Wt 112.2 lb

## 2023-08-02 DIAGNOSIS — G43109 Migraine with aura, not intractable, without status migrainosus: Secondary | ICD-10-CM | POA: Diagnosis not present

## 2023-08-02 NOTE — Patient Instructions (Addendum)
 Help finding a primary care provider within [place]. If you do not have access to a computer or the Internet you can call [phone] and  they will help you scheduled with a provider.  Or you can go to: [email or web address]      Limit use of pain relievers to no more than 9 days out of the month.  These medications include acetaminophen , NSAIDs (ibuprofen /Advil /Motrin , naproxen/Aleve, triptans (Imitrex/sumatriptan), Excedrin, and narcotics.  This will help reduce risk of rebound headaches. Be aware of common food triggers Routine exercise Stay adequately hydrated (aim for 64 oz water daily) Keep headache diary Maintain proper stress management Maintain proper sleep hygiene Do not skip meals Consider supplements:  May take MigreLief.  Consider adding CoQ10 300mg  daily If migraines worsen, please follow up.

## 2023-08-10 ENCOUNTER — Encounter: Payer: Self-pay | Admitting: Neurology

## 2023-08-10 ENCOUNTER — Other Ambulatory Visit: Payer: Self-pay | Admitting: Neurology

## 2023-08-10 ENCOUNTER — Other Ambulatory Visit (HOSPITAL_COMMUNITY): Payer: Self-pay

## 2023-08-10 MED ORDER — SUMATRIPTAN SUCCINATE 50 MG PO TABS
50.0000 mg | ORAL_TABLET | ORAL | 5 refills | Status: DC | PRN
  Filled 2023-08-10: qty 10, 5d supply, fill #0

## 2023-08-14 ENCOUNTER — Other Ambulatory Visit: Payer: Self-pay | Admitting: Neurology

## 2023-08-14 ENCOUNTER — Other Ambulatory Visit (HOSPITAL_COMMUNITY): Payer: Self-pay

## 2023-08-14 MED ORDER — RIZATRIPTAN BENZOATE 10 MG PO TBDP
10.0000 mg | ORAL_TABLET | ORAL | 5 refills | Status: DC | PRN
  Filled 2023-08-14: qty 10, 5d supply, fill #0
  Filled 2023-08-23: qty 10, 10d supply, fill #0

## 2023-08-23 ENCOUNTER — Other Ambulatory Visit (HOSPITAL_COMMUNITY): Payer: Self-pay

## 2023-10-31 ENCOUNTER — Other Ambulatory Visit (INDEPENDENT_AMBULATORY_CARE_PROVIDER_SITE_OTHER)

## 2023-10-31 ENCOUNTER — Ambulatory Visit (INDEPENDENT_AMBULATORY_CARE_PROVIDER_SITE_OTHER): Admitting: Gastroenterology

## 2023-10-31 ENCOUNTER — Encounter: Payer: Self-pay | Admitting: Gastroenterology

## 2023-10-31 VITALS — BP 118/62 | HR 82 | Ht 60.0 in | Wt 115.0 lb

## 2023-10-31 DIAGNOSIS — R194 Change in bowel habit: Secondary | ICD-10-CM

## 2023-10-31 DIAGNOSIS — R109 Unspecified abdominal pain: Secondary | ICD-10-CM

## 2023-10-31 DIAGNOSIS — K589 Irritable bowel syndrome without diarrhea: Secondary | ICD-10-CM

## 2023-10-31 DIAGNOSIS — K625 Hemorrhage of anus and rectum: Secondary | ICD-10-CM | POA: Diagnosis not present

## 2023-10-31 DIAGNOSIS — K649 Unspecified hemorrhoids: Secondary | ICD-10-CM

## 2023-10-31 LAB — CBC WITH DIFFERENTIAL/PLATELET
Basophils Absolute: 0 K/uL (ref 0.0–0.1)
Basophils Relative: 0.7 % (ref 0.0–3.0)
Eosinophils Absolute: 0.2 K/uL (ref 0.0–0.7)
Eosinophils Relative: 4.8 % (ref 0.0–5.0)
HCT: 38.2 % (ref 36.0–46.0)
Hemoglobin: 12.9 g/dL (ref 12.0–15.0)
Lymphocytes Relative: 42.3 % (ref 12.0–46.0)
Lymphs Abs: 1.7 K/uL (ref 0.7–4.0)
MCHC: 33.8 g/dL (ref 30.0–36.0)
MCV: 88.8 fl (ref 78.0–100.0)
Monocytes Absolute: 0.4 K/uL (ref 0.1–1.0)
Monocytes Relative: 10.4 % (ref 3.0–12.0)
Neutro Abs: 1.6 K/uL (ref 1.4–7.7)
Neutrophils Relative %: 41.8 % — ABNORMAL LOW (ref 43.0–77.0)
Platelets: 166 K/uL (ref 150.0–400.0)
RBC: 4.3 Mil/uL (ref 3.87–5.11)
RDW: 13.3 % (ref 11.5–15.5)
WBC: 3.9 K/uL — ABNORMAL LOW (ref 4.0–10.5)

## 2023-10-31 LAB — COMPREHENSIVE METABOLIC PANEL WITH GFR
ALT: 14 U/L (ref 0–35)
AST: 18 U/L (ref 0–37)
Albumin: 4.4 g/dL (ref 3.5–5.2)
Alkaline Phosphatase: 46 U/L (ref 39–117)
BUN: 13 mg/dL (ref 6–23)
CO2: 28 meq/L (ref 19–32)
Calcium: 9.1 mg/dL (ref 8.4–10.5)
Chloride: 104 meq/L (ref 96–112)
Creatinine, Ser: 0.9 mg/dL (ref 0.40–1.20)
GFR: 83.08 mL/min (ref >60.00)
Glucose, Bld: 88 mg/dL (ref 70–99)
Potassium: 4.1 meq/L (ref 3.5–5.1)
Sodium: 137 meq/L (ref 135–145)
Total Bilirubin: 0.4 mg/dL (ref 0.2–1.2)
Total Protein: 6.8 g/dL (ref 6.0–8.3)

## 2023-10-31 LAB — TSH: TSH: 3.38 u[IU]/mL (ref 0.35–5.50)

## 2023-10-31 LAB — IBC + FERRITIN
Ferritin: 21.1 ng/mL (ref 10.0–291.0)
Iron: 98 ug/dL (ref 42–145)
Saturation Ratios: 33.3 % (ref 20.0–50.0)
TIBC: 294 ug/dL (ref 250.0–450.0)
Transferrin: 210 mg/dL — ABNORMAL LOW (ref 212.0–360.0)

## 2023-10-31 LAB — SEDIMENTATION RATE: Sed Rate: 1 mm/h (ref 0–20)

## 2023-10-31 LAB — C-REACTIVE PROTEIN: CRP: <1 mg/dL (ref 0.5–20.0)

## 2023-10-31 NOTE — Patient Instructions (Signed)
 Your provider has requested that you go to the basement level for lab work before leaving today. Press B on the elevator. The lab is located at the first door on the left as you exit the elevator.  Due to recent changes in healthcare laws, you may see the results of your imaging and laboratory studies on MyChart before your provider has had a chance to review them.  We understand that in some cases there may be results that are confusing or concerning to you. Not all laboratory results come back in the same time frame and the provider may be waiting for multiple results in order to interpret others.  Please give us  48 hours in order for your provider to thoroughly review all the results before contacting the office for clarification of your results.   _______________________________________________________  If your blood pressure at your visit was 140/90 or greater, please contact your primary care physician to follow up on this.  _______________________________________________________  If you are age 65 or older, your body mass index should be between 23-30. Your Body mass index is 22.46 kg/m. If this is out of the aforementioned range listed, please consider follow up with your Primary Care Provider.  If you are age 12 or younger, your body mass index should be between 19-25. Your Body mass index is 22.46 kg/m. If this is out of the aformentioned range listed, please consider follow up with your Primary Care Provider.   ________________________________________________________  The [facility] providers would like to encourage you to use MYCHART to communicate with providers for non-urgent requests or questions.  Due to long hold times on the telephone, sending your provider a message by [facility] may be a faster and more efficient way to get a response.  Please allow 48 business hours for a response.  Please remember that this is for non-urgent requests.   _______________________________________________________  [facility] is using a team-based approach to care.  Your team is made up of your doctor and two to three APPS. Our APPS (Nurse Practitioners and Physician Assistants) work with your physician to ensure care continuity for you. They are fully qualified to address your health concerns and develop a treatment plan. They communicate directly with your gastroenterologist to care for you. Seeing the Advanced Practice Practitioners on your physician's team can help you by facilitating care more promptly, often allowing for earlier appointments, access to diagnostic testing, procedures, and other specialty referrals.   Thank you for choosing me and [facility].  Camie Furbish, PA-C

## 2023-10-31 NOTE — Progress Notes (Unsigned)
 Stacey Mccullough [id number] 1990-12-20   Chief Complaint:  Referring Provider: No ref. provider found Primary GI MD: Dr. Avram  HPI: Stacey Mccullough is a 33 y.o. female with past medical history of chronic epigastric pain, IBS, Lyme disease 1994 who presents today for a complaint of blood in stool.    Last seen in office by Dr. Avram in 2015.  Has had lower gi bloating, cramping, constipation Saw streaks of blood possibly Did hemoccult test, faint positive possibly Sees BRBPR   No rectal pain Has been a while since pain from hemorrhoids Has intermittent constipation Not using anything OTC, just increases fluid intake Does have some bloating, cramping, PMS symptoms Some mucus in stool No fever, chills,  Had 5ths disease, kids caught it, had a rash, had arthitis with this Has been having knee issues  Has flare ups with trigger foods, stress Can have diarrhea  Requests home FOBT cards Concerned about possible autoimmune Has had right knee pain   Previous GI Procedures/Imaging   Colonoscopy 06/21/2013 - Normal mucosa in the terminal ileum, multiple biopsies were performed, question occult Crohn's possible as cause of her problems - Normal colon with excellent prep Path: Ileum, biopsy - BENIGN SMALL BOWEL TYPE MUCOSA. - THERE IS NO EVIDENCE OF SIGNIFICANT INFLAMMATION, DYSPLASIA OR MALIGNANCY.  EGD 07/31/2012 Erosions in the gastric antrum, multiple biopsies The remainder of the upper endoscopy exam was otherwise normal Path: Surgical [P], gastric, biopsy - GASTRIC ANTRAL-TYPE MUCOSA WITH REACTIVE-REGENERATIVE CHANGES AND SURFACE EROSION. - NO EVIDENCE OF HELICOBACTER PYLORI, INTESTINAL METAPLASIA, DYSPLASIA OR MALIGNANCY.  Past Medical History:  Diagnosis Date   Chronic epigastric pain    since Dec/comes and goes past eating   IBS (irritable bowel syndrome)    Lyme disease 1994   Nausea     Past Surgical History:  Procedure Laterality Date   BREAST  SURGERY     CESAREAN SECTION N/A 10/04/2021   Procedure: CESAREAN SECTION;  Surgeon: Sudie Lavonia HERO, MD;  Location: [facility];  Service: Obstetrics;  Laterality: N/A;   COLONOSCOPY N/A 06/21/2013   Procedure: COLONOSCOPY;  Surgeon: Lupita FORBES Avram, MD;  Location: [facility];  Service: Endoscopy;  Laterality: N/A;   TONSILLECTOMY     TONSILLECTOMY AND ADENOIDECTOMY  03/28/2005   UPPER GASTROINTESTINAL ENDOSCOPY     WISDOM TOOTH EXTRACTION      Current Outpatient Medications  Medication Sig Dispense Refill   acetaminophen  (TYLENOL ) 500 MG tablet Take 1,000 mg by mouth as needed for mild pain.      aspirin-acetaminophen -caffeine (EXCEDRIN MIGRAINE) 250-250-65 MG tablet Take 1 tablet by mouth every 8 (eight) hours as needed.     norethindrone  (CAMILA ) 0.35 MG tablet Take 1 tablet (0.35 mg total) by mouth daily. 84 tablet 1   rizatriptan  (MAXALT -MLT) 10 MG disintegrating tablet Take 1 tablet (10 mg total) by mouth as needed for migraine. May repeat in 2 hours if needed.  Maximum 2 tablets in 24 hours. 10 tablet 5   No current facility-administered medications for this visit.    Allergies as of 10/31/2023   (No Known Allergies)    Family History  Problem Relation Age of Onset   Hypertension Mother    Stroke Mother    Stroke Father    Asthma Sister    Asthma Brother        x 2   Cancer Paternal Uncle        Bone Sarcoma   Alzheimer's disease Maternal Grandmother     Social History  Tobacco Use   Smoking status: Never   Smokeless tobacco: Never  Vaping Use   Vaping status: Never Used  Substance Use Topics   Alcohol use: Yes    Comment: soacial- 2 per month   Drug use: No     Review of Systems:    Constitutional: No weight loss, fever, chills, weakness or fatigue Eyes: No change in vision Ears, Nose, Throat:  No change in hearing or congestion Skin: No rash or itching Cardiovascular: No chest pain, chest pressure or palpitations   Respiratory: No SOB or  cough Gastrointestinal: See HPI and otherwise negative Genitourinary: No dysuria or change in urinary frequency Neurological: No headache, dizziness or syncope Musculoskeletal: No new muscle or joint pain Hematologic: No bleeding or bruising    Physical Exam:  Vital signs: There were no vitals taken for this visit.  Constitutional: NAD, Well developed, Well nourished, alert and cooperative Head:  Normocephalic and atraumatic.  Eyes: No scleral icterus. Conjunctiva pink. Mouth: No oral lesions. Respiratory: Respirations even and unlabored. Lungs clear to auscultation bilaterally.  No wheezes, crackles, or rhonchi.  Cardiovascular:  Regular rate and rhythm. No murmurs. No peripheral edema. Gastrointestinal:  Soft, nondistended, nontender. No rebound or guarding. Normal bowel sounds. No appreciable masses or hepatomegaly. Rectal:  Not performed.  Neurologic:  Alert and oriented x4;  grossly normal neurologically.  Skin:   Dry and intact without significant lesions or rashes. Psychiatric: Oriented to person, place and time. Demonstrates good judgement and reason without abnormal affect or behaviors.   RELEVANT LABS AND IMAGING: CBC    Component Value Date/Time   WBC 12.0 (H) 10/05/2021 0553   RBC 3.14 (L) 10/05/2021 0553   HGB 9.9 (L) 10/05/2021 0553   HGB 13.6 10/12/2017 1248   HCT 29.1 (L) 10/05/2021 0553   HCT 39.7 10/12/2017 1248   PLT 113 (L) 10/05/2021 0553   PLT 167 10/12/2017 1248   MCV 92.7 10/05/2021 0553   MCV 87 10/12/2017 1248   MCH 31.5 10/05/2021 0553   MCHC 34.0 10/05/2021 0553   RDW 14.0 10/05/2021 0553   RDW 13.5 10/12/2017 1248   LYMPHSABS 2.0 10/12/2017 1248   MONOABS 0.5 05/16/2013 1606   EOSABS 0.1 10/12/2017 1248   BASOSABS 0.0 10/12/2017 1248    CMP     Component Value Date/Time   NA 141 10/12/2017 1248   K 4.6 10/12/2017 1248   CL 105 10/12/2017 1248   CO2 22 10/12/2017 1248   GLUCOSE 91 10/12/2017 1248   GLUCOSE 61 (L) 07/15/2017 1138    BUN 13 10/12/2017 1248   CREATININE 0.73 10/12/2017 1248   CALCIUM 9.2 10/12/2017 1248   PROT 6.7 10/12/2017 1248   ALBUMIN 4.6 10/12/2017 1248   AST 16 10/12/2017 1248   ALT 10 10/12/2017 1248   ALKPHOS 64 10/12/2017 1248   BILITOT 0.4 10/12/2017 1248   GFRNONAA 112 10/12/2017 1248   GFRAA 129 10/12/2017 1248     Assessment/Plan:    CBC, CMP, iron/ferritin, TSH, ESR, CRP, TTG, IgA FOBT x3 Fecal calprotectin 6 week follow up   Camie Furbish, PA-C [facility] 10/31/2023, 12:56 PM  Patient Care Team: Patient, No Pcp Per as PCP - General (General Practice) Delana Ted Morrison, DO as PCP - OBGYN (Obstetrics and Gynecology) Skeet Juliene SAUNDERS, DO as Consulting Physician (Neurology)

## 2023-11-01 ENCOUNTER — Encounter: Payer: Self-pay | Admitting: Gastroenterology

## 2023-11-01 LAB — TISSUE TRANSGLUTAMINASE, IGA: (tTG) Ab, IgA: <1 U/mL

## 2023-11-03 ENCOUNTER — Ambulatory Visit: Payer: Self-pay | Admitting: Gastroenterology

## 2023-11-21 NOTE — Telephone Encounter (Signed)
 Camie is it ok for the pt to continue her super greens prior to stool studies

## 2023-11-22 ENCOUNTER — Other Ambulatory Visit

## 2023-11-22 DIAGNOSIS — G44039 Episodic paroxysmal hemicrania, not intractable: Secondary | ICD-10-CM

## 2023-11-23 ENCOUNTER — Ambulatory Visit (INDEPENDENT_AMBULATORY_CARE_PROVIDER_SITE_OTHER)

## 2023-11-23 DIAGNOSIS — R194 Change in bowel habit: Secondary | ICD-10-CM | POA: Diagnosis not present

## 2023-11-23 DIAGNOSIS — K625 Hemorrhage of anus and rectum: Secondary | ICD-10-CM

## 2023-11-23 DIAGNOSIS — R109 Unspecified abdominal pain: Secondary | ICD-10-CM

## 2023-11-23 DIAGNOSIS — K589 Irritable bowel syndrome without diarrhea: Secondary | ICD-10-CM

## 2023-11-23 DIAGNOSIS — K649 Unspecified hemorrhoids: Secondary | ICD-10-CM | POA: Diagnosis not present

## 2023-11-23 LAB — HEMOCCULT SLIDES (X 3 CARDS)
Fecal Occult Blood: NEGATIVE
OCCULT 1: NEGATIVE
OCCULT 2: NEGATIVE
OCCULT 3: NEGATIVE
OCCULT 4: NEGATIVE
OCCULT 5: NEGATIVE

## 2023-11-28 ENCOUNTER — Other Ambulatory Visit (HOSPITAL_COMMUNITY): Payer: Self-pay

## 2023-11-28 ENCOUNTER — Other Ambulatory Visit: Payer: Self-pay | Admitting: Neurology

## 2023-11-28 ENCOUNTER — Encounter: Payer: Self-pay | Admitting: Neurology

## 2023-11-28 MED ORDER — NURTEC 75 MG PO TBDP
1.0000 | ORAL_TABLET | Freq: Every day | ORAL | 5 refills | Status: DC | PRN
  Filled 2023-11-28: qty 16, 30d supply, fill #0

## 2023-11-28 MED ORDER — RIZATRIPTAN BENZOATE 10 MG PO TBDP: 10.0000 mg | ORAL_TABLET | ORAL | 5 refills | Status: DC | PRN

## 2023-11-29 ENCOUNTER — Other Ambulatory Visit (HOSPITAL_COMMUNITY): Payer: Self-pay

## 2023-11-29 LAB — CALPROTECTIN: Calprotectin: 14 ug/g

## 2023-11-30 ENCOUNTER — Ambulatory Visit: Payer: Self-pay | Admitting: Physician Assistant

## 2023-12-08 ENCOUNTER — Telehealth: Payer: Self-pay | Admitting: Pharmacy Technician

## 2023-12-08 ENCOUNTER — Other Ambulatory Visit (HOSPITAL_COMMUNITY): Payer: Self-pay

## 2023-12-08 NOTE — Telephone Encounter (Signed)
 Pharmacy Patient Advocate Encounter   Received notification from Physician's Office that prior authorization for NURTEC 75MG  is required/requested.   Insurance verification completed.   The patient is insured through [facility] .   Per test claim: PA required; PA submitted to above mentioned insurance via Latent Key/confirmation #/EOC [id number] Status is pending

## 2023-12-12 ENCOUNTER — Other Ambulatory Visit (HOSPITAL_COMMUNITY): Payer: Self-pay

## 2023-12-12 NOTE — Telephone Encounter (Signed)
 Pharmacy Patient Advocate Encounter  Received notification from [employer] that Prior Authorization for NURTEC 75MG  has been CANCELLED due to   PA #/Case ID/Reference #: [id number]

## 2023-12-19 ENCOUNTER — Ambulatory Visit: Admitting: Neurology

## 2023-12-24 NOTE — Progress Notes (Unsigned)
 Stacey Mccullough [id number] [birth year]   Chief Complaint:  Referring Provider: No ref. provider found Primary GI MD: Dr. Avram   HPI: Stacey Mccullough is a 33 y.o. female with past medical history of chronic epigastric pain, IBS, Lyme disease 1994 who presents today for follow up.   Seen in office by Dr. Avram in 2015, at that time had been having epigastric pain, abdominal distention, and diarrhea.  Some slight improvement on hyoscyamine , no improvement on PPI at that time.  Celiac antibodies were negative. She underwent EGD 07/31/2012 with erosions in the gastric antrum and biopsies unremarkable. Colonoscopy 06/21/2013 was normal with negative biopsies.   At last visit 10/31/2023 patient reported a recent episode of abdominal cramping and bloating followed by a bowel movement in which she thought she may have seen streaks of blood in her stool but was unsure.  Had a Hemoccult test which she picked up from the pharmacy which was not definitively positive.  Reported history of hemorrhoids and occasionally sees BRBPR.  No recent problems with hemorrhoids no recent rectal pain from hemorrhoids.  Endorsed history of IBS and occasionally had some diarrhea if stressed or based on diet.  Occasional constipation as well for which she increases her water intake. Endorsed having recently had a viral infection (fifth disease) which she picked up from her children who are in daycare, subsequently developed a rash and joint pain and continued to have some pain in her knees at last visit.  Was concerned about possible autoimmune disease.  Labs showed no infection or anemia, normal liver and kidney function, normal thyroid function, negative inflammatory markers.  Normal iron level with ferritin of 21 and so was advised to start OTC ferrous sulfate 325 mg.   Hemoccult negative x 3.  Fecal calprotectin was negative.    Previous GI Procedures/Imaging   Colonoscopy 06/21/2013 - Normal mucosa in the terminal  ileum, multiple biopsies were performed, question occult Crohn's possible as cause of her problems - Normal colon with excellent prep Path: Ileum, biopsy - BENIGN SMALL BOWEL TYPE MUCOSA. - THERE IS NO EVIDENCE OF SIGNIFICANT INFLAMMATION, DYSPLASIA OR MALIGNANCY.   EGD 07/31/2012 - Erosions in the gastric antrum, multiple biopsies - The remainder of the upper endoscopy exam was otherwise normal Path: Surgical [P], gastric, biopsy - GASTRIC ANTRAL-TYPE MUCOSA WITH REACTIVE-REGENERATIVE CHANGES AND SURFACE EROSION. - NO EVIDENCE OF HELICOBACTER PYLORI, INTESTINAL METAPLASIA, DYSPLASIA OR MALIGNANCY.   Past Medical History:  Diagnosis Date   Chronic epigastric pain    since Dec/comes and goes past eating   IBS (irritable bowel syndrome)    Lyme disease 1994   Migraine    Nausea     Past Surgical History:  Procedure Laterality Date   BREAST SURGERY     CESAREAN SECTION N/A 10/04/2021   Procedure: CESAREAN SECTION;  Surgeon: Sudie Lavonia HERO, MD;  Location: [facility];  Service: Obstetrics;  Laterality: N/A;   COLONOSCOPY N/A 06/21/2013   Procedure: COLONOSCOPY;  Surgeon: Lupita FORBES Avram, MD;  Location: [facility];  Service: Endoscopy;  Laterality: N/A;   TONSILLECTOMY     TONSILLECTOMY AND ADENOIDECTOMY  03/28/2005   UPPER GASTROINTESTINAL ENDOSCOPY     WISDOM TOOTH EXTRACTION      Current Outpatient Medications  Medication Sig Dispense Refill   acetaminophen  (TYLENOL ) 500 MG tablet Take 1,000 mg by mouth as needed for mild pain.      aspirin-acetaminophen -caffeine (EXCEDRIN MIGRAINE) 250-250-65 MG tablet Take 1 tablet by mouth every 8 (eight) hours as needed.  Rimegepant Sulfate  (NURTEC) 75 MG TBDP Take 1 tablet (75 mg total) by mouth daily as needed. 16 tablet 5   rizatriptan  (MAXALT -MLT) 10 MG disintegrating tablet Take 1 tablet (10 mg total) by mouth as needed for migraine. May repeat in 2 hours if needed.  Maximum 2 tablets in 24 hours. 10 tablet 5   No current  facility-administered medications for this visit.    Allergies as of 12/25/2023   (No Known Allergies)    Family History  Problem Relation Age of Onset   Hypertension Mother    Stroke Mother    Stroke Father    Lung cancer Father    Asthma Sister    Asthma Brother        x 2   Alzheimer's disease Maternal Grandmother    Breast cancer Maternal Grandmother    Cancer Paternal Uncle        Bone Sarcoma   Liver disease Neg Hx    Colon cancer Neg Hx    Esophageal cancer Neg Hx     Social History   Tobacco Use   Smoking status: Never   Smokeless tobacco: Never  Vaping Use   Vaping status: Never Used  Substance Use Topics   Alcohol use: Yes    Comment: soacial- 2 per month   Drug use: No     Review of Systems:    Constitutional: No weight loss, fever, chills, weakness or fatigue Eyes: No change in vision Ears, Nose, Throat:  No change in hearing or congestion Skin: No rash or itching Cardiovascular: No chest pain, chest pressure or palpitations   Respiratory: No SOB or cough Gastrointestinal: See HPI and otherwise negative Genitourinary: No dysuria or change in urinary frequency Neurological: No headache, dizziness or syncope Musculoskeletal: No new muscle or joint pain Hematologic: No bleeding or bruising    Physical Exam:  Vital signs: There were no vitals taken for this visit.  Constitutional: NAD, Well developed, Well nourished, alert and cooperative Head:  Normocephalic and atraumatic.  Eyes: No scleral icterus. Conjunctiva pink. Mouth: No oral lesions. Respiratory: Respirations even and unlabored. Lungs clear to auscultation bilaterally.  No wheezes, crackles, or rhonchi.  Cardiovascular:  Regular rate and rhythm. No murmurs. No peripheral edema. Gastrointestinal:  Soft, nondistended, nontender. No rebound or guarding. Normal bowel sounds. No appreciable masses or hepatomegaly. Rectal:  Not performed.  Neurologic:  Alert and oriented x4;  grossly normal  neurologically.  Skin:   Dry and intact without significant lesions or rashes. Psychiatric: Oriented to person, place and time. Demonstrates good judgement and reason without abnormal affect or behaviors.   RELEVANT LABS AND IMAGING: CBC    Component Value Date/Time   WBC 3.9 (L) 10/31/2023 1502   RBC 4.30 10/31/2023 1502   HGB 12.9 10/31/2023 1502   HGB 13.6 10/12/2017 1248   HCT 38.2 10/31/2023 1502   HCT 39.7 10/12/2017 1248   PLT 166.0 10/31/2023 1502   PLT 167 10/12/2017 1248   MCV 88.8 10/31/2023 1502   MCV 87 10/12/2017 1248   MCH 31.5 10/05/2021 0553   MCHC 33.8 10/31/2023 1502   RDW 13.3 10/31/2023 1502   RDW 13.5 10/12/2017 1248   LYMPHSABS 1.7 10/31/2023 1502   LYMPHSABS 2.0 10/12/2017 1248   MONOABS 0.4 10/31/2023 1502   EOSABS 0.2 10/31/2023 1502   EOSABS 0.1 10/12/2017 1248   BASOSABS 0.0 10/31/2023 1502   BASOSABS 0.0 10/12/2017 1248    CMP     Component Value Date/Time   NA  137 10/31/2023 1502   NA 141 10/12/2017 1248   K 4.1 10/31/2023 1502   CL 104 10/31/2023 1502   CO2 28 10/31/2023 1502   GLUCOSE 88 10/31/2023 1502   BUN 13 10/31/2023 1502   BUN 13 10/12/2017 1248   CREATININE 0.90 10/31/2023 1502   CALCIUM 9.1 10/31/2023 1502   PROT 6.8 10/31/2023 1502   PROT 6.7 10/12/2017 1248   ALBUMIN 4.4 10/31/2023 1502   ALBUMIN 4.6 10/12/2017 1248   AST 18 10/31/2023 1502   ALT 14 10/31/2023 1502   ALKPHOS 46 10/31/2023 1502   BILITOT 0.4 10/31/2023 1502   BILITOT 0.4 10/12/2017 1248   GFRNONAA 112 10/12/2017 1248   GFRAA 129 10/12/2017 1248     Assessment/Plan:    Repeat iron levels, d/c iron supplement if normal   Camie Furbish, PA-C [facility] 12/24/2023, 8:35 PM  Patient Care Team: Patient, No Pcp Per as PCP - General (General Practice) Banga, Ted Morrison, DO as PCP - OBGYN (Obstetrics and Gynecology) Skeet Juliene SAUNDERS, DO as Consulting Physician (Neurology)

## 2023-12-25 ENCOUNTER — Other Ambulatory Visit (HOSPITAL_COMMUNITY): Payer: Self-pay

## 2023-12-25 ENCOUNTER — Other Ambulatory Visit (INDEPENDENT_AMBULATORY_CARE_PROVIDER_SITE_OTHER)

## 2023-12-25 ENCOUNTER — Encounter: Payer: Self-pay | Admitting: Gastroenterology

## 2023-12-25 ENCOUNTER — Ambulatory Visit (INDEPENDENT_AMBULATORY_CARE_PROVIDER_SITE_OTHER): Admitting: Gastroenterology

## 2023-12-25 VITALS — BP 100/60 | HR 93 | Ht 60.0 in | Wt 113.0 lb

## 2023-12-25 DIAGNOSIS — K589 Irritable bowel syndrome without diarrhea: Secondary | ICD-10-CM | POA: Diagnosis not present

## 2023-12-25 DIAGNOSIS — K625 Hemorrhage of anus and rectum: Secondary | ICD-10-CM | POA: Diagnosis not present

## 2023-12-25 DIAGNOSIS — R790 Abnormal level of blood mineral: Secondary | ICD-10-CM

## 2023-12-25 DIAGNOSIS — R1011 Right upper quadrant pain: Secondary | ICD-10-CM

## 2023-12-25 DIAGNOSIS — K649 Unspecified hemorrhoids: Secondary | ICD-10-CM

## 2023-12-25 LAB — CBC WITH DIFFERENTIAL/PLATELET
Basophils Absolute: 0 K/uL (ref 0.0–0.1)
Basophils Relative: 0.7 % (ref 0.0–3.0)
Eosinophils Absolute: 0.2 K/uL (ref 0.0–0.7)
Eosinophils Relative: 4.8 % (ref 0.0–5.0)
HCT: 39.8 % (ref 36.0–46.0)
Hemoglobin: 13.5 g/dL (ref 12.0–15.0)
Lymphocytes Relative: 43.2 % (ref 12.0–46.0)
Lymphs Abs: 2.1 K/uL (ref 0.7–4.0)
MCHC: 33.9 g/dL (ref 30.0–36.0)
MCV: 89.6 fl (ref 78.0–100.0)
Monocytes Absolute: 0.5 K/uL (ref 0.1–1.0)
Monocytes Relative: 9.9 % (ref 3.0–12.0)
Neutro Abs: 2 K/uL (ref 1.4–7.7)
Neutrophils Relative %: 41.4 % — ABNORMAL LOW (ref 43.0–77.0)
Platelets: 179 K/uL (ref 150.0–400.0)
RBC: 4.45 Mil/uL (ref 3.87–5.11)
RDW: 12.7 % (ref 11.5–15.5)
WBC: 4.8 K/uL (ref 4.0–10.5)

## 2023-12-25 LAB — IBC + FERRITIN
Ferritin: 21.9 ng/mL (ref 10.0–291.0)
Iron: 94 ug/dL (ref 42–145)
Saturation Ratios: 33.2 % (ref 20.0–50.0)
TIBC: 282.8 ug/dL (ref 250.0–450.0)
Transferrin: 202 mg/dL — ABNORMAL LOW (ref 212.0–360.0)

## 2023-12-25 MED ORDER — HYDROCORTISONE (PERIANAL) 2.5 % EX CREA
1.0000 | TOPICAL_CREAM | Freq: Two times a day (BID) | CUTANEOUS | 1 refills | Status: DC
  Filled 2023-12-25 (×2): qty 30, 10d supply, fill #0

## 2023-12-25 NOTE — Patient Instructions (Signed)
 We have sent the following medications to your pharmacy for you to pick up at your convenience: Hydrocortisone 2.5 % cream: use twice a day, for about a week as needed  Please go to the lab in the basement of our building to have lab work done as you leave today. Hit B for basement when you get on the elevator.  When the doors open the lab is on your left.  We will call you with the results. Thank you.  Please follow up as needed.  Thank you for entrusting me with your care and for choosing [facility], [place]ie [place]urb[place], [place]    _______________________________________________________  If your blood pressure at your visit was 140/90 or greater, please contact your primary care physician to follow up on this.  _______________________________________________________  If you are age 68 or older, your body mass index should be between 23-30. Your Body mass index is 22.07 kg/m. If this is out of the aforementioned range listed, please consider follow up with your Primary Care Provider.  If you are age 79 or younger, your body mass index should be between 19-25. Your Body mass index is 22.07 kg/m. If this is out of the aformentioned range listed, please consider follow up with your Primary Care Provider.   ________________________________________________________  The [facility] providers would like to encourage you to use MYCHART to communicate with providers for non-urgent requests or questions.  Due to long hold times on the telephone, sending your provider a message by [facility] may be a faster and more efficient way to get a response.  Please allow 48 business hours for a response.  Please remember that this is for non-urgent requests.  _______________________________________________________  [facility] is using a team-based approach to care.  Your team is made up of your doctor and two to three APPS. Our APPS (Nurse Practitioners and Physician Assistants) work with your  physician to ensure care continuity for you. They are fully qualified to address your health concerns and develop a treatment plan. They communicate directly with your gastroenterologist to care for you. Seeing the Advanced Practice Practitioners on your physician's team can help you by facilitating care more promptly, often allowing for earlier appointments, access to diagnostic testing, procedures, and other specialty referrals.

## 2023-12-26 ENCOUNTER — Ambulatory Visit: Payer: Self-pay | Admitting: Gastroenterology

## 2023-12-26 DIAGNOSIS — R1011 Right upper quadrant pain: Secondary | ICD-10-CM

## 2023-12-27 ENCOUNTER — Ambulatory Visit: Payer: Self-pay | Admitting: Nurse Practitioner

## 2023-12-27 DIAGNOSIS — D239 Other benign neoplasm of skin, unspecified: Secondary | ICD-10-CM | POA: Insufficient documentation

## 2023-12-27 DIAGNOSIS — Z85828 Personal history of other malignant neoplasm of skin: Secondary | ICD-10-CM | POA: Insufficient documentation

## 2023-12-27 DIAGNOSIS — I789 Disease of capillaries, unspecified: Secondary | ICD-10-CM | POA: Insufficient documentation

## 2023-12-27 DIAGNOSIS — K589 Irritable bowel syndrome without diarrhea: Secondary | ICD-10-CM | POA: Insufficient documentation

## 2023-12-27 DIAGNOSIS — Z862 Personal history of diseases of the blood and blood-forming organs and certain disorders involving the immune mechanism: Secondary | ICD-10-CM | POA: Insufficient documentation

## 2023-12-27 DIAGNOSIS — C44619 Basal cell carcinoma of skin of left upper limb, including shoulder: Secondary | ICD-10-CM | POA: Insufficient documentation

## 2023-12-27 DIAGNOSIS — Q210 Ventricular septal defect: Secondary | ICD-10-CM | POA: Insufficient documentation

## 2024-01-06 ENCOUNTER — Other Ambulatory Visit (HOSPITAL_COMMUNITY): Payer: Self-pay

## 2024-01-09 NOTE — Telephone Encounter (Signed)
 Camie ok to order see your last note below    RUQ discomfort Patient reports having a few days of intermittent RUQ discomfort which felt like congestion in which occurred about an hour after eating each time.  She has not had any recurrence of this discomfort since.  Felt she may have been a little constipated at that time.   - If recurrence of RUQ discomfort consider RUQ ultrasound to evaluate the gallbladder

## 2024-01-10 NOTE — Addendum Note (Signed)
 Addended by: ANITRA ODETTA CROME on: 01/10/2024 01:48 PM   Modules accepted: Orders

## 2024-01-19 ENCOUNTER — Ambulatory Visit (HOSPITAL_COMMUNITY)

## 2024-01-22 ENCOUNTER — Ambulatory Visit (HOSPITAL_COMMUNITY)
Admission: RE | Admit: 2024-01-22 | Discharge: 2024-01-22 | Disposition: A | Discharge status: Home / Self Care | Source: Ambulatory Visit | Attending: Gastroenterology | Admitting: Gastroenterology

## 2024-01-22 ENCOUNTER — Ambulatory Visit: Payer: Self-pay | Admitting: Gastroenterology

## 2024-01-22 DIAGNOSIS — R935 Abnormal findings on diagnostic imaging of other abdominal regions, including retroperitoneum: Secondary | ICD-10-CM

## 2024-01-22 DIAGNOSIS — R1011 Right upper quadrant pain: Secondary | ICD-10-CM | POA: Diagnosis present

## 2024-01-23 ENCOUNTER — Telehealth: Payer: Self-pay | Admitting: Gastroenterology

## 2024-01-23 NOTE — Telephone Encounter (Signed)
 MRI is pending nurse review-- I was told if pt has not had a CT scan, they will want that before MRI. Pt's apt is in the am at 7-- we may need to push it out a day or so until we hear back.  AM The insurance rep said that someone should call me by 5:00-- if they do, I will let you know.  2:13 PM I think we need to wait and see what they say its an MRCP for dilated bile duct and abd pain with abnormal US  - Daphne looks like you set up do you agree  1  2:18 PM Daphne Moats, RN was added by you. 2:19 PM Camie is that ok to wait and see   2:19 PM BD Daphne Moats, RN I think we should wait and see. I don't think a CT will show us  what we need to see   1  2:21 PM Agree   2:21 PM April D McPeak Here is the number to call if we need to get the auth by 7 am [phone]  PENDING CASE # [id number]  AM Ellouise RAMAN is who I have been speaking with  2:23 PM BD Daphne Moats, RN Thank you April   1  2:23 PM AM SH Camie FORBES Furbish, PA-C I am off this afternoon  2:53 PM BD [facility] approval # [id number] was given to the pt   I will document tomorrow   Delon at [employer] gave the approval

## 2024-01-23 NOTE — Telephone Encounter (Signed)
 Inbound call from patient stating she was able to call radiology and get a sooner appointment for tomorrow at 7am for her MRCP but they stated she needed to make sure her insurance will cover since she's moving the date from 01/29/24 to tomorrow patient requesting a call back to confirm appointment  Please advise  Thank you

## 2024-01-24 ENCOUNTER — Ambulatory Visit (HOSPITAL_COMMUNITY)
Admission: RE | Admit: 2024-01-24 | Discharge: 2024-01-24 | Disposition: A | Discharge status: Home / Self Care | Source: Ambulatory Visit | Attending: Gastroenterology | Admitting: Gastroenterology

## 2024-01-24 ENCOUNTER — Ambulatory Visit (HOSPITAL_COMMUNITY)

## 2024-01-24 ENCOUNTER — Ambulatory Visit: Payer: Self-pay | Admitting: Gastroenterology

## 2024-01-24 ENCOUNTER — Other Ambulatory Visit: Payer: Self-pay | Admitting: Gastroenterology

## 2024-01-24 DIAGNOSIS — R935 Abnormal findings on diagnostic imaging of other abdominal regions, including retroperitoneum: Secondary | ICD-10-CM

## 2024-01-24 DIAGNOSIS — R1011 Right upper quadrant pain: Secondary | ICD-10-CM | POA: Insufficient documentation

## 2024-01-24 MED ORDER — GADOBUTROL 1 MMOL/ML IV SOLN
7.0000 mL | Freq: Once | INTRAVENOUS | Status: AC | PRN
  Administered 2024-01-24: 7 mL via INTRAVENOUS

## 2024-01-24 NOTE — Telephone Encounter (Signed)
 The pt has been advised and states she is doing well and wishes to wait on any medications or testing at this time. She will call back if things change.

## 2024-01-29 ENCOUNTER — Ambulatory Visit (HOSPITAL_COMMUNITY)

## 2024-01-31 ENCOUNTER — Ambulatory Visit (HOSPITAL_COMMUNITY)

## 2024-02-29 ENCOUNTER — Other Ambulatory Visit (HOSPITAL_COMMUNITY): Payer: Self-pay

## 2024-02-29 MED ORDER — TACROLIMUS 0.1 % EX OINT
1.0000 | TOPICAL_OINTMENT | Freq: Two times a day (BID) | CUTANEOUS | 2 refills | Status: AC
  Filled 2024-02-29: qty 60, 30d supply, fill #0

## 2024-03-04 NOTE — Progress Notes (Unsigned)
 NEUROLOGY FOLLOW UP OFFICE NOTE  Stacey Mccullough [id number]  Assessment/Plan:   Migraine with aura, without status migrainosus, not intractable  Migraine prevention:  MigreLief-M Migraine rescue:  Nurtec PRN Lifestyle modification: Limit use of pain relievers to no more than 9 days out of the month to prevent risk of rebound or medication-overuse headache. Diet modification/hydration/caffeine cessation Routine exercise Sleep hygiene Keep headache diary Follow up 1 year     Subjective:  Stacey Mccullough is a 33 year old right-handed female with IBS who follows up for migraines.  UPDATE: For acute therapy, rizatriptan  ineffective.  Nurtec effective.  Intensity:  2/10 Duration:  within one hour with Nurtec but still has postdrome for 1 to 2 days Frequency:  4 since May  Current NSAIDS/analgesics:  Excedrin Migraine, ibuprofen , Tylenol  Current triptans:  none Current ergotamine:  none Current anti-emetic:  none Current muscle relaxants:  none Current Antihypertensive medications:  none Current Antidepressant medications:  none Current Anticonvulsant medications:  none Current anti-CGRP:  Nurtec PRN Current Vitamins/Herbal/Supplements:  MigreLief-M (Mg, B2, feverfew) which she thinks has been helpful.   Current Antihistamines/Decongestants:  none Other therapy:  none Birth control:  none  Caffeine:  1 cup coffee daily (rarely 2 cups).  Sometimes a soda in the evening. Diet:  60-80 oz water daily.  Tries not skip meals (sometimes breakfast).  No specific diet. Exercise:  not routine but active at work and caring for her children. Depression:  no; Anxiety:  only prior to migraine Other pain:  no Sleep hygiene:  son may wake up in middle of night but otherwise okay  HISTORY: Patient has history of migraine with aura, which were infrequent.   Location:  band-like Quality:  pounding Intensity:  7-8/10 Aura:  preceded by seeing zigzag pattern and flashing lights for 5  minutes. Prodrome:  anxiety/sensation of impending doom, mild dizziness Postdrome:  tension/scalp soreness, stiff neck, brain fog, photosensitivity and osmophobia lasting for a day. Associated symptoms:  Nausea, photophobia, phonophobia, diarrhea.  She denies associated unilateral numbness or weakness. Duration:  1 hour with treatment (otherwise several hours) Relieving factors:  rest, caffeine (cola) They have always been infrequent, she had 3 migraines in a 3 year period.  In March 2025, she has had increased frequency, a cluster of 3 migraines in a 5 week period.    The visual aura seemed localized to her right eye.  Also, the postdrome lasted up to 5 days.  Her last migraine was about a week ago.  In early May 2025, she has had some dizziness associated with a dull headache.  Head feels stuffy.  With quick head movements or change in position, she feels a sense of off-balance but not frank spinning.  The dizziness lasts a few seconds but the headache may last 1-2 hours.  Responds to the Epley maneuver.    When this cluster of migraines started, she had been under a great deal of stress.  Her father was ill and passed away.  She is now handling the estate process.  During this period of time, she had not been eating much.  She also had just stopped nursing her son after 15 months.  Her GYN checked her prolactin level which was slightly low.  She was prescribed Camila  but hasn't started it yet.     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      aspirin-acetaminophen -caffeine (EXCEDRIN MIGRAINE) 250-250-65 MG tablet Take 1 tablet by mouth every 8 (eight) hours as needed.     ferrous sulfate 325 (65 FE) MG EC tablet Take 325 mg by mouth daily with breakfast.     hydrocortisone  (ANUSOL -HC) 2.5 % rectal cream Place 1 Application rectally 2 (two) times daily for one week as needed 30 g 1   IBUPROFEN  PO Take by mouth daily as needed.     OVER THE COUNTER MEDICATION Migrelief supplement   One tablet twice daily to prevent migraines     Rimegepant Sulfate  (NURTEC) 75 MG TBDP Take 1 tablet (75 mg total) by mouth daily as needed. 16 tablet 5   tacrolimus  (PROTOPIC ) 0.1 % ointment Apply topically 2 (two) times daily. 60 g 2   No current facility-administered medications on file prior to visit.    ALLERGIES: No Known Allergies  FAMILY HISTORY: Family History  Problem Relation Age of Onset   Hypertension Mother    Stroke Mother    Stroke Father    Lung cancer Father    Asthma Sister    Asthma Brother        x 2   Alzheimer's disease Maternal Grandmother    Breast cancer Maternal Grandmother    Cancer Paternal Uncle        Bone Sarcoma   Liver disease Neg Hx    Colon cancer Neg Hx    Esophageal cancer Neg Hx       Objective:  Blood pressure 112/67, pulse 78, height 5' (1.524 m), weight 114 lb 9.6 oz (52 kg), SpO2 100%, unknown if currently breastfeeding. General: No acute distress.  Patient appears well-groomed.     Stacey Dunnings, DO

## 2024-03-05 ENCOUNTER — Encounter: Payer: Self-pay | Admitting: Neurology

## 2024-03-05 ENCOUNTER — Ambulatory Visit: Admitting: Neurology

## 2024-03-05 ENCOUNTER — Other Ambulatory Visit (HOSPITAL_COMMUNITY): Payer: Self-pay

## 2024-03-05 VITALS — BP 112/67 | HR 78 | Ht 60.0 in | Wt 114.6 lb

## 2024-03-05 DIAGNOSIS — G43109 Migraine with aura, not intractable, without status migrainosus: Secondary | ICD-10-CM

## 2024-03-05 MED ORDER — NURTEC 75 MG PO TBDP
1.0000 | ORAL_TABLET | Freq: Every day | ORAL | 11 refills | Status: AC | PRN
  Filled 2024-03-05: qty 16, 16d supply, fill #0

## 2024-03-05 NOTE — Patient Instructions (Signed)
 Help finding a primary care provider within [place]. If you do not have access to a computer or the Internet you can call [phone] and  they will help you scheduled with a provider.  Or you can go to: [email or web address]

## 2025-03-10 ENCOUNTER — Ambulatory Visit: Admitting: Neurology
